# Patient Record
Sex: Male | Born: 1945 | Race: Black or African American | Hispanic: No | Smoking: Former smoker
Health system: Southern US, Community
[De-identification: ages and names within clinical notes are randomized; demographics above are authoritative.]

## PROBLEM LIST (undated history)

## (undated) DIAGNOSIS — E079 Disorder of thyroid, unspecified: Secondary | ICD-10-CM

## (undated) DIAGNOSIS — E785 Hyperlipidemia, unspecified: Secondary | ICD-10-CM

## (undated) DIAGNOSIS — M1990 Unspecified osteoarthritis, unspecified site: Secondary | ICD-10-CM

## (undated) DIAGNOSIS — E119 Type 2 diabetes mellitus without complications: Secondary | ICD-10-CM

## (undated) DIAGNOSIS — G5603 Carpal tunnel syndrome, bilateral upper limbs: Secondary | ICD-10-CM

## (undated) DIAGNOSIS — H269 Unspecified cataract: Secondary | ICD-10-CM

## (undated) DIAGNOSIS — M5412 Radiculopathy, cervical region: Secondary | ICD-10-CM

## (undated) DIAGNOSIS — R011 Cardiac murmur, unspecified: Secondary | ICD-10-CM

## (undated) HISTORY — PX: COLONOSCOPY: SHX174

## (undated) HISTORY — DX: Radiculopathy, cervical region: M54.12

## (undated) HISTORY — PX: OTHER SURGICAL HISTORY: SHX169

## (undated) HISTORY — DX: Unspecified cataract: H26.9

## (undated) HISTORY — PX: CATARACT EXTRACTION W/ INTRAOCULAR LENS IMPLANT: SHX1309

## (undated) HISTORY — DX: Disorder of thyroid, unspecified: E07.9

## (undated) HISTORY — DX: Hyperlipidemia, unspecified: E78.5

## (undated) HISTORY — DX: Carpal tunnel syndrome, bilateral upper limbs: G56.03

## (undated) HISTORY — DX: Type 2 diabetes mellitus without complications: E11.9

## (undated) HISTORY — PX: BILATERAL CARPAL TUNNEL RELEASE: SHX6508

---

## 1999-11-19 ENCOUNTER — Encounter: Admission: RE | Admit: 1999-11-19 | Discharge: 1999-11-19 | Payer: Self-pay | Admitting: Emergency Medicine

## 1999-11-19 ENCOUNTER — Encounter: Payer: Self-pay | Admitting: Emergency Medicine

## 2005-06-11 ENCOUNTER — Ambulatory Visit (HOSPITAL_COMMUNITY): Admission: RE | Admit: 2005-06-11 | Discharge: 2005-06-11 | Payer: Self-pay | Admitting: Ophthalmology

## 2010-09-28 ENCOUNTER — Encounter
Admission: RE | Admit: 2010-09-28 | Discharge: 2010-09-28 | Payer: Self-pay | Source: Home / Self Care | Attending: Internal Medicine | Admitting: Internal Medicine

## 2010-10-19 ENCOUNTER — Encounter
Admission: RE | Admit: 2010-10-19 | Discharge: 2010-10-19 | Payer: Self-pay | Source: Home / Self Care | Attending: Internal Medicine | Admitting: Internal Medicine

## 2010-10-30 ENCOUNTER — Other Ambulatory Visit (HOSPITAL_COMMUNITY): Payer: Self-pay | Admitting: Neurosurgery

## 2010-10-30 ENCOUNTER — Ambulatory Visit (HOSPITAL_COMMUNITY)
Admission: RE | Admit: 2010-10-30 | Discharge: 2010-10-30 | Disposition: A | Discharge status: Home / Self Care | Payer: 59 | Source: Ambulatory Visit | Attending: Neurosurgery | Admitting: Neurosurgery

## 2010-10-30 ENCOUNTER — Encounter (HOSPITAL_COMMUNITY)
Admission: RE | Admit: 2010-10-30 | Discharge: 2010-10-30 | Disposition: A | Discharge status: Home / Self Care | Payer: 59 | Source: Ambulatory Visit | Attending: Neurosurgery | Admitting: Neurosurgery

## 2010-10-30 DIAGNOSIS — M5020 Other cervical disc displacement, unspecified cervical region: Secondary | ICD-10-CM | POA: Insufficient documentation

## 2010-10-30 DIAGNOSIS — Z01818 Encounter for other preprocedural examination: Secondary | ICD-10-CM | POA: Insufficient documentation

## 2010-10-30 LAB — CBC
HCT: 38.4 % — ABNORMAL LOW (ref 39.0–52.0)
Hemoglobin: 13.6 g/dL (ref 13.0–17.0)
MCH: 31.1 pg (ref 26.0–34.0)
MCHC: 35.4 g/dL (ref 30.0–36.0)
MCV: 87.9 fL (ref 78.0–100.0)
Platelets: 235 10*3/uL (ref 150–400)
RBC: 4.37 MIL/uL (ref 4.22–5.81)
RDW: 12.9 % (ref 11.5–15.5)
WBC: 9 10*3/uL (ref 4.0–10.5)

## 2010-10-30 LAB — SURGICAL PCR SCREEN: Staphylococcus aureus: NEGATIVE

## 2010-11-02 ENCOUNTER — Ambulatory Visit (HOSPITAL_COMMUNITY): Payer: 59

## 2010-11-02 ENCOUNTER — Inpatient Hospital Stay (HOSPITAL_COMMUNITY)
Admission: RE | Admit: 2010-11-02 | Discharge: 2010-11-05 | DRG: 473 | Disposition: A | Discharge status: Home / Self Care | Payer: 59 | Source: Ambulatory Visit | Attending: Neurosurgery | Admitting: Neurosurgery

## 2010-11-02 DIAGNOSIS — M4712 Other spondylosis with myelopathy, cervical region: Principal | ICD-10-CM | POA: Diagnosis present

## 2010-11-06 NOTE — Op Note (Addendum)
NAME:  Stephen, Barnett NO.:  [id number]  MEDICAL RECORD NO.:  [id number]           PATIENT TYPE:  O  LOCATION:  XRAY                         FACILITY:  [facility]  PHYSICIAN:  Danae Orleans. Venetia Maxon, M.D.  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  PROCEDURE: 1. C2-C6 posterior cervical decompression by laminectomy. 2. Fusion with C2 pedicle screws and C3-C6 lateral mass screws and     rods. 3. Posterolateral arthrodesis with local autograft, Puragen, and     allograft.  SURGEON:  Danae Orleans. Venetia Maxon, MD  ASSISTANT:  Georgiann Cocker, RN and Clydene Fake, MD  ANESTHESIA:  General endotracheal anesthesia.  ESTIMATED BLOOD LOSS:  100 mL.  COMPLICATIONS:  None.  DISPOSITION:  Recovery.  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  It was elected to take him to surgery for posterior cervical decompression and fusion.  He has marked spasticity and is severely limited in terms of spastic gait and by weakness.  He has cord signal from C3 through C4.  He has significant spinal cord compression C2, C3- 4, and C5-6 levels.  PROCEDURE:  Mr. Stephen Barnett was brought to the operating room.  Following satisfactory and uncomplicated induction of general endotracheal anesthesia plus intravenous lines and Foley catheter, he was maintained in neutral alignment, placed in cervical collar, turned into a prone position on the operating table on chest rolls.  His neck was maintained in neutral alignment.  His shoulders were taped and padded appropriately.  His posterior  neck was then shaved, prepped, and draped in usual sterile fashion.  Area of planned incision was infiltrated with local lidocaine.  Incision was made from C2 to C3-C6, carried through copious adipose tissue to the posterior cervical fascia which was incised with electrocautery.  Subperiosteal dissection was performed exposing the C2-C6 spinous processes and lateral masses bilaterally. The patient is extremely large, 75-mm retractor blades were required to gain adequate exposure.  After identifying the lateral masses and confirming correct level, the spinous processes of C2, C3, C4, C5, and C6 were removed and lateral mass screws using 4 x 12 mm screws were placed at C3, C4, C5, and C6.  Subsequently, the posterior cervical decompression was performed with Leksell and 2-3 mm gold tip Kerrison rongeurs to decompress the spinal cord dura from C2 to the top of C7. The dura was well decompressed.  Spinal cord appeared to be pulsatile. The care was taken to adequately decompress the spinal cord at the C2-3 level and with decompression well into C2.  The pedicle screws were then placed at C2 using the C-arm fluoroscopy and also according to standard landmarks, the 28-mm x 4-mm screws were placed, one at bilaterally and their position was confirmed on lateral fluoroscopy.  The rods were then  cut and dosed to fit the screw heads appropriately, locked down in situ. The posterolateral region was extensively decorticated prior to placing the rods and then 10 mL of Vitoss foam which had been reconstituted. Large Puragen was then placed in the posterolateral region along with the autograft which had been cleaned from the investing soft tissues and morselized.  A medium Hemovac chain drain was inserted through separate stab incision.  The wound was then closed with 0, 2-0, and 3-0 Vicryl sutures and staples were then reapproximated with skin edges.  Sterile occlusive dressing was placed.  The patient  was taken to recovery and tolerated the procedure well.     Danae Orleans. Venetia Maxon, M.D.     JDS/MEDQ  D:  11/02/2010  T:  11/03/2010  Job:  [id number]  Electronically Signed by Maeola Harman M.D. on 11/06/2010 10:06:32 AM

## 2010-11-30 NOTE — Discharge Summary (Signed)
  NAME:  DOSS, CYBULSKI NO.:  [id number]  MEDICAL RECORD NO.:  [id number]           PATIENT TYPE:  I  LOCATION:  3038                         FACILITY:  [facility]  PHYSICIAN:  Danae Orleans. Venetia Maxon, M.D.  DATE OF BIRTH:  1945/11/26  DATE OF ADMISSION:  11/02/2010 DATE OF DISCHARGE:  11/05/2010                              DISCHARGE SUMMARY   REASON FOR ADMISSION:  Stephen Barnett is a 65 year old man with severe cervical spondylitic myelopathy and cervical stenosis.  He was admitted to the hospital for management of the spastic myelopathy with cord compression and he underwent posterior cervical decompression and fusion C2 through C6 levels with decompression and hardware fixation. Postoperatively, the patient was observed in the ICU.  He had a drain. He was still hyperreflexic, but had improved strength.  The patient was doing well on November 05, 2010, and was discharged home as of November 05, 2010, with return to the office 10 days postoperatively for staple removal.  Discharge medications include Levothroid 100 mcg daily, his preoperative medication along with Percocet and Valium for muscle spasm.     Danae Orleans. Venetia Maxon, M.D.     JDS/MEDQ  D:  11/15/2010  T:  11/15/2010  Job:  [id number]  Electronically Signed by Maeola Harman M.D. on 11/30/2010 12:13:54 PM

## 2011-02-08 NOTE — Op Note (Signed)
NAME:  Stephen Barnett, Stephen Barnett NO.:  [id number]   MEDICAL RECORD NO.:  [id number]          PATIENT TYPE:  AMB   LOCATION:  SDS                          FACILITY:  [facility]   PHYSICIAN:  Guadelupe Sabin, M.D.DATE OF BIRTH:  15-Sep-1946   DATE OF PROCEDURE:  06/11/2005  DATE OF DISCHARGE:                                 OPERATIVE REPORT   PREOPERATIVE DIAGNOSIS:  Posterior subcapsular cataract, right eye.   POSTOPERATIVE DIAGNOSIS:  Posterior subcapsular cataract, right eye.   OPERATION:  Planned extracapsular cataract extraction - phacoemulsification,  primary insertion of posterior chamber intraocular lens implant.   SURGEON:  Stormy Fabian, M.D.   ASSISTANT:  Nurse.   ANESTHESIA:  Local 4% Xylocaine, 0.75% Marcaine retrobulbar blocking. The  patient was given intravenous anesthesia during the period of retrobulbar  blocking.   OPERATIVE PROCEDURE:  After the patient was prepped and draped, a lid  speculum was inserted in the right eye. Schiotz tonometry was recorded at 7-  8 scale units with a 5.5 grams weight. A peritomy was performed adjacent to  the limbus from the 11-1 o'clock position. The corneoscleral junction was  cleaned and a corneoscleral groove made with a 45 degrees Superblade. The  anterior chamber was then entered with the 2.5 mm diamond keratome at the 12  o'clock position and a 15 degrees blade at the 2:30 position. Using a bent  26-gauge needle on an Ocucoat syringe, a circular capsulorrhexis was begun  and then completed with the Grabow forceps. Hydrodissection and  hydrodelineation were performed using 1% Xylocaine. The 30 degrees  phacoemulsification tip was then inserted with slow controlled  emulsification of the lens nucleus with back cracking and fragmentation with  the Bechert pick. Total ultrasonic time was 58 seconds, average power level  14%, total amount of fluid used 60 mL. Following removal of the nucleus, the  residual cortex was  aspirated with the irrigation-aspiration tip. The  posterior capsule appeared intact with a brilliant red fundus reflex. It  was, therefore, elected to insert an [employer] SI 40 NB  silicone three-piece posterior chamber intraocular lens implant, diopter  strength +19.50. This was inserted with the McDonald forceps into the  anterior chamber and then centered into the capsular bag using the [facility]  lens rotator. The lens appeared to be well-centered. The Ocucoat and Provisc  which had been used intermittently throughout the procedures were aspirated  and replaced with balanced salt solution and Miochol ophthalmic solution.  The operative incision appeared to be self-sealing. It was, however, elected  to place a single 10-0 interrupted nylon suture across the 12 o'clock  incision to ensure closure and to prevent endophthalmitis. Maxitrol ointment  was  instilled in the conjunctival cul-de-sac and a light patch and protector  shield applied. Duration of procedure and anesthesia administration was 45  minutes. The patient tolerated the procedure well in general and left the  operating room for the recovery room in good condition.           ______________________________  Guadelupe Sabin, M.D.     HNJ/MEDQ  D:  06/11/2005  T:  06/11/2005  Job:  [id number]

## 2011-02-08 NOTE — H&P (Signed)
NAME:  Stephen Barnett, Stephen Barnett NO.:  [id number]   MEDICAL RECORD NO.:  [id number]          PATIENT TYPE:  AMB   LOCATION:  SDS                          FACILITY:  [facility]   PHYSICIAN:  Guadelupe Sabin, M.D.DATE OF BIRTH:  1946/05/08   DATE OF ADMISSION:  06/11/2005  DATE OF DISCHARGE:                                HISTORY & PHYSICAL   This was a planned outpatient admission of this 65 year old male admitted  for cataract implant surgery of the right eye.   PRESENT ILLNESS:  This patient was first seen in my office at the request of  Dr. Leslee Home, his internist, for evaluation of decreased vision. Initial  examination in my office revealed a visual acuity of 20/100 right eye, 20/30  minus left eye without correction and 20/40 right eye, 20/20 left eye with  correction. The patient was moderately myopic greater in the right than left  eye. Slit lamp examination revealed a clear cornea, deep and clear anterior  chamber and a posterior subcapsular cataract greater in the right than left  eye. It was felt that this was changing his refraction and causing his  blurred vision. He elected to proceed with cataract implant surgery of the  right eye now, left eye later as needed. He was given oral discussion and  printed information concerning the procedure. He signed an informed consent  and arrangements were made for his outpatient admission at this time.   PAST MEDICAL HISTORY:  The patient is in good general health under the care  of Dr. Lorenz Coaster and takes no prescribed medications and has no known  allergies.   REVIEW OF SYSTEMS:  No cardiorespiratory complaints.   PHYSICAL EXAMINATION:  VITAL SIGNS:  As recorded on admission blood pressure  119/78, pulse 56, respirations 16, temperature 97.1.  GENERAL APPEARANCE:  The patient is a pleasant well-nourished, well-  developed male in no acute distress.  HEENT:  EYES:  Visual ocular exam as noted above.  CHEST/LUNGS:  Clear  to percussion and auscultation.  HEART:  Normal sinus rhythm, no cardiomegaly. No murmurs.  ABDOMEN:  Negative.  EXTREMITIES:  Negative.   ADMISSION DIAGNOSIS:  Posterior subcapsular cataract right eye greater than  left.   SURGICAL PLAN:  Cataract implant surgery right eye now, left eye later.           ______________________________  Guadelupe Sabin, M.D.     HNJ/MEDQ  D:  06/11/2005  T:  06/11/2005  Job:  [id number]   cc:   Reuben Likes, M.D.  [address]  [place]  [place] [address]  Fax: [phone]

## 2012-12-10 DIAGNOSIS — Z125 Encounter for screening for malignant neoplasm of prostate: Secondary | ICD-10-CM | POA: Diagnosis not present

## 2012-12-10 DIAGNOSIS — Z0000 Encounter for general adult medical examination without abnormal findings: Secondary | ICD-10-CM | POA: Diagnosis not present

## 2013-01-11 DIAGNOSIS — R7309 Other abnormal glucose: Secondary | ICD-10-CM | POA: Diagnosis not present

## 2013-01-11 DIAGNOSIS — E039 Hypothyroidism, unspecified: Secondary | ICD-10-CM | POA: Diagnosis not present

## 2013-04-12 DIAGNOSIS — R7989 Other specified abnormal findings of blood chemistry: Secondary | ICD-10-CM | POA: Diagnosis not present

## 2013-12-14 DIAGNOSIS — E039 Hypothyroidism, unspecified: Secondary | ICD-10-CM | POA: Diagnosis not present

## 2013-12-14 DIAGNOSIS — Z6834 Body mass index (BMI) 34.0-34.9, adult: Secondary | ICD-10-CM | POA: Diagnosis not present

## 2013-12-14 DIAGNOSIS — E663 Overweight: Secondary | ICD-10-CM | POA: Diagnosis not present

## 2013-12-14 DIAGNOSIS — Z0000 Encounter for general adult medical examination without abnormal findings: Secondary | ICD-10-CM | POA: Diagnosis not present

## 2013-12-14 DIAGNOSIS — E785 Hyperlipidemia, unspecified: Secondary | ICD-10-CM | POA: Diagnosis not present

## 2013-12-14 DIAGNOSIS — Z125 Encounter for screening for malignant neoplasm of prostate: Secondary | ICD-10-CM | POA: Diagnosis not present

## 2013-12-14 DIAGNOSIS — R7309 Other abnormal glucose: Secondary | ICD-10-CM | POA: Diagnosis not present

## 2014-01-12 DIAGNOSIS — Z23 Encounter for immunization: Secondary | ICD-10-CM | POA: Diagnosis not present

## 2014-12-21 ENCOUNTER — Ambulatory Visit
Admission: RE | Admit: 2014-12-21 | Discharge: 2014-12-21 | Disposition: A | Discharge status: Home / Self Care | Payer: Medicare Other | Source: Ambulatory Visit | Attending: Internal Medicine | Admitting: Internal Medicine

## 2014-12-21 ENCOUNTER — Other Ambulatory Visit: Payer: Self-pay | Admitting: Internal Medicine

## 2014-12-21 DIAGNOSIS — E785 Hyperlipidemia, unspecified: Secondary | ICD-10-CM | POA: Diagnosis not present

## 2014-12-21 DIAGNOSIS — I10 Essential (primary) hypertension: Secondary | ICD-10-CM | POA: Diagnosis not present

## 2014-12-21 DIAGNOSIS — Z0000 Encounter for general adult medical examination without abnormal findings: Secondary | ICD-10-CM | POA: Diagnosis not present

## 2014-12-21 DIAGNOSIS — E039 Hypothyroidism, unspecified: Secondary | ICD-10-CM | POA: Diagnosis not present

## 2014-12-21 DIAGNOSIS — M5136 Other intervertebral disc degeneration, lumbar region: Secondary | ICD-10-CM | POA: Diagnosis not present

## 2014-12-21 DIAGNOSIS — Z1389 Encounter for screening for other disorder: Secondary | ICD-10-CM | POA: Diagnosis not present

## 2014-12-21 DIAGNOSIS — Z125 Encounter for screening for malignant neoplasm of prostate: Secondary | ICD-10-CM | POA: Diagnosis not present

## 2014-12-21 DIAGNOSIS — M47816 Spondylosis without myelopathy or radiculopathy, lumbar region: Secondary | ICD-10-CM | POA: Diagnosis not present

## 2014-12-21 DIAGNOSIS — M545 Low back pain: Secondary | ICD-10-CM

## 2014-12-21 DIAGNOSIS — R7309 Other abnormal glucose: Secondary | ICD-10-CM | POA: Diagnosis not present

## 2015-01-02 DIAGNOSIS — R7309 Other abnormal glucose: Secondary | ICD-10-CM | POA: Diagnosis not present

## 2015-06-01 DIAGNOSIS — I10 Essential (primary) hypertension: Secondary | ICD-10-CM | POA: Diagnosis not present

## 2015-06-01 DIAGNOSIS — M545 Low back pain: Secondary | ICD-10-CM | POA: Diagnosis not present

## 2015-06-07 ENCOUNTER — Other Ambulatory Visit: Payer: Self-pay | Admitting: Internal Medicine

## 2015-06-07 DIAGNOSIS — M5430 Sciatica, unspecified side: Secondary | ICD-10-CM

## 2015-06-12 ENCOUNTER — Ambulatory Visit
Admission: RE | Admit: 2015-06-12 | Discharge: 2015-06-12 | Disposition: A | Discharge status: Home / Self Care | Payer: Medicare Other | Source: Ambulatory Visit | Attending: Internal Medicine | Admitting: Internal Medicine

## 2015-06-12 DIAGNOSIS — M5430 Sciatica, unspecified side: Secondary | ICD-10-CM

## 2015-06-12 DIAGNOSIS — M4806 Spinal stenosis, lumbar region: Secondary | ICD-10-CM | POA: Diagnosis not present

## 2015-07-24 DIAGNOSIS — Z6833 Body mass index (BMI) 33.0-33.9, adult: Secondary | ICD-10-CM | POA: Diagnosis not present

## 2015-07-24 DIAGNOSIS — M5416 Radiculopathy, lumbar region: Secondary | ICD-10-CM | POA: Diagnosis not present

## 2015-07-24 DIAGNOSIS — M5126 Other intervertebral disc displacement, lumbar region: Secondary | ICD-10-CM | POA: Diagnosis not present

## 2015-07-24 DIAGNOSIS — R030 Elevated blood-pressure reading, without diagnosis of hypertension: Secondary | ICD-10-CM | POA: Diagnosis not present

## 2015-07-24 DIAGNOSIS — M545 Low back pain: Secondary | ICD-10-CM | POA: Diagnosis not present

## 2015-07-28 DIAGNOSIS — Z01812 Encounter for preprocedural laboratory examination: Secondary | ICD-10-CM | POA: Diagnosis not present

## 2015-07-28 DIAGNOSIS — M5126 Other intervertebral disc displacement, lumbar region: Secondary | ICD-10-CM | POA: Diagnosis not present

## 2015-08-04 DIAGNOSIS — M5136 Other intervertebral disc degeneration, lumbar region: Secondary | ICD-10-CM | POA: Diagnosis not present

## 2015-08-04 DIAGNOSIS — M4726 Other spondylosis with radiculopathy, lumbar region: Secondary | ICD-10-CM | POA: Diagnosis not present

## 2015-08-04 DIAGNOSIS — M5126 Other intervertebral disc displacement, lumbar region: Secondary | ICD-10-CM | POA: Diagnosis not present

## 2015-08-04 DIAGNOSIS — M5116 Intervertebral disc disorders with radiculopathy, lumbar region: Secondary | ICD-10-CM | POA: Diagnosis not present

## 2015-12-11 DIAGNOSIS — M5416 Radiculopathy, lumbar region: Secondary | ICD-10-CM | POA: Diagnosis not present

## 2015-12-11 DIAGNOSIS — M5126 Other intervertebral disc displacement, lumbar region: Secondary | ICD-10-CM | POA: Diagnosis not present

## 2015-12-11 DIAGNOSIS — M545 Low back pain: Secondary | ICD-10-CM | POA: Diagnosis not present

## 2015-12-11 DIAGNOSIS — Z6835 Body mass index (BMI) 35.0-35.9, adult: Secondary | ICD-10-CM | POA: Diagnosis not present

## 2015-12-11 DIAGNOSIS — R030 Elevated blood-pressure reading, without diagnosis of hypertension: Secondary | ICD-10-CM | POA: Diagnosis not present

## 2015-12-27 DIAGNOSIS — Z0000 Encounter for general adult medical examination without abnormal findings: Secondary | ICD-10-CM | POA: Diagnosis not present

## 2015-12-27 DIAGNOSIS — E785 Hyperlipidemia, unspecified: Secondary | ICD-10-CM | POA: Diagnosis not present

## 2015-12-27 DIAGNOSIS — Z125 Encounter for screening for malignant neoplasm of prostate: Secondary | ICD-10-CM | POA: Diagnosis not present

## 2015-12-27 DIAGNOSIS — E039 Hypothyroidism, unspecified: Secondary | ICD-10-CM | POA: Diagnosis not present

## 2015-12-27 DIAGNOSIS — Z23 Encounter for immunization: Secondary | ICD-10-CM | POA: Diagnosis not present

## 2015-12-27 DIAGNOSIS — Z1389 Encounter for screening for other disorder: Secondary | ICD-10-CM | POA: Diagnosis not present

## 2015-12-27 DIAGNOSIS — D126 Benign neoplasm of colon, unspecified: Secondary | ICD-10-CM | POA: Diagnosis not present

## 2016-01-30 DIAGNOSIS — K640 First degree hemorrhoids: Secondary | ICD-10-CM | POA: Diagnosis not present

## 2016-01-30 DIAGNOSIS — K626 Ulcer of anus and rectum: Secondary | ICD-10-CM | POA: Diagnosis not present

## 2016-01-30 DIAGNOSIS — K6289 Other specified diseases of anus and rectum: Secondary | ICD-10-CM | POA: Diagnosis not present

## 2016-01-30 DIAGNOSIS — Z86010 Personal history of colonic polyps: Secondary | ICD-10-CM | POA: Diagnosis not present

## 2016-04-17 DIAGNOSIS — R030 Elevated blood-pressure reading, without diagnosis of hypertension: Secondary | ICD-10-CM | POA: Diagnosis not present

## 2016-04-17 DIAGNOSIS — Z6835 Body mass index (BMI) 35.0-35.9, adult: Secondary | ICD-10-CM | POA: Diagnosis not present

## 2016-04-17 DIAGNOSIS — M5416 Radiculopathy, lumbar region: Secondary | ICD-10-CM | POA: Diagnosis not present

## 2016-04-17 DIAGNOSIS — M545 Low back pain: Secondary | ICD-10-CM | POA: Diagnosis not present

## 2016-04-22 ENCOUNTER — Other Ambulatory Visit: Payer: Self-pay | Admitting: Neurosurgery

## 2016-04-22 DIAGNOSIS — M5416 Radiculopathy, lumbar region: Secondary | ICD-10-CM

## 2016-05-02 ENCOUNTER — Ambulatory Visit
Admission: RE | Admit: 2016-05-02 | Discharge: 2016-05-02 | Disposition: A | Discharge status: Home / Self Care | Payer: Medicare Other | Source: Ambulatory Visit | Attending: Neurosurgery | Admitting: Neurosurgery

## 2016-05-02 DIAGNOSIS — M5416 Radiculopathy, lumbar region: Secondary | ICD-10-CM

## 2016-05-02 DIAGNOSIS — M4806 Spinal stenosis, lumbar region: Secondary | ICD-10-CM | POA: Diagnosis not present

## 2016-05-02 MED ORDER — GADOBENATE DIMEGLUMINE 529 MG/ML IV SOLN
20.0000 mL | Freq: Once | INTRAVENOUS | Status: AC | PRN
  Administered 2016-05-02: 20 mL via INTRAVENOUS

## 2016-05-08 ENCOUNTER — Other Ambulatory Visit: Payer: Self-pay | Admitting: Neurosurgery

## 2016-05-08 DIAGNOSIS — M5126 Other intervertebral disc displacement, lumbar region: Secondary | ICD-10-CM | POA: Diagnosis not present

## 2016-05-08 DIAGNOSIS — Z6835 Body mass index (BMI) 35.0-35.9, adult: Secondary | ICD-10-CM | POA: Diagnosis not present

## 2016-05-08 DIAGNOSIS — R030 Elevated blood-pressure reading, without diagnosis of hypertension: Secondary | ICD-10-CM | POA: Diagnosis not present

## 2016-05-08 DIAGNOSIS — M545 Low back pain: Secondary | ICD-10-CM | POA: Diagnosis not present

## 2016-05-08 DIAGNOSIS — M5416 Radiculopathy, lumbar region: Secondary | ICD-10-CM | POA: Diagnosis not present

## 2016-05-09 ENCOUNTER — Encounter (HOSPITAL_COMMUNITY): Payer: Self-pay | Admitting: *Deleted

## 2016-05-09 NOTE — Progress Notes (Signed)
Spoke with pt for pre-op call. Pt denies cardiac history, chest pain or sob. Pt's PCP is Dr. Seward Carol.

## 2016-05-10 ENCOUNTER — Encounter (HOSPITAL_COMMUNITY): Payer: Self-pay | Admitting: *Deleted

## 2016-05-10 ENCOUNTER — Observation Stay (HOSPITAL_COMMUNITY)
Admission: RE | Admit: 2016-05-10 | Discharge: 2016-05-10 | Disposition: A | Discharge status: Home / Self Care | Payer: Medicare Other | Source: Ambulatory Visit | Attending: Neurosurgery | Admitting: Neurosurgery

## 2016-05-10 ENCOUNTER — Ambulatory Visit (HOSPITAL_COMMUNITY): Payer: Medicare Other

## 2016-05-10 ENCOUNTER — Ambulatory Visit (HOSPITAL_COMMUNITY): Payer: Medicare Other | Admitting: Anesthesiology

## 2016-05-10 ENCOUNTER — Encounter (HOSPITAL_COMMUNITY)
Admission: RE | Disposition: A | Discharge status: Home / Self Care | Payer: Self-pay | Source: Ambulatory Visit | Attending: Neurosurgery

## 2016-05-10 DIAGNOSIS — M1990 Unspecified osteoarthritis, unspecified site: Secondary | ICD-10-CM | POA: Diagnosis not present

## 2016-05-10 DIAGNOSIS — Z87891 Personal history of nicotine dependence: Secondary | ICD-10-CM | POA: Insufficient documentation

## 2016-05-10 DIAGNOSIS — M5116 Intervertebral disc disorders with radiculopathy, lumbar region: Secondary | ICD-10-CM | POA: Diagnosis not present

## 2016-05-10 DIAGNOSIS — Z01812 Encounter for preprocedural laboratory examination: Secondary | ICD-10-CM | POA: Diagnosis not present

## 2016-05-10 DIAGNOSIS — M5126 Other intervertebral disc displacement, lumbar region: Secondary | ICD-10-CM | POA: Diagnosis present

## 2016-05-10 DIAGNOSIS — M5416 Radiculopathy, lumbar region: Secondary | ICD-10-CM | POA: Diagnosis not present

## 2016-05-10 DIAGNOSIS — Z419 Encounter for procedure for purposes other than remedying health state, unspecified: Secondary | ICD-10-CM

## 2016-05-10 DIAGNOSIS — M4806 Spinal stenosis, lumbar region: Secondary | ICD-10-CM | POA: Diagnosis not present

## 2016-05-10 HISTORY — DX: Cardiac murmur, unspecified: R01.1

## 2016-05-10 HISTORY — DX: Unspecified osteoarthritis, unspecified site: M19.90

## 2016-05-10 HISTORY — PX: LUMBAR LAMINECTOMY/DECOMPRESSION MICRODISCECTOMY: SHX5026

## 2016-05-10 LAB — CBC
HEMATOCRIT: 41.4 % (ref 39.0–52.0)
Hemoglobin: 14.5 g/dL (ref 13.0–17.0)
MCH: 31.5 pg (ref 26.0–34.0)
MCHC: 35 g/dL (ref 30.0–36.0)
MCV: 90 fL (ref 78.0–100.0)
PLATELETS: 210 10*3/uL (ref 150–400)
RBC: 4.6 MIL/uL (ref 4.22–5.81)
RDW: 12.7 % (ref 11.5–15.5)
WBC: 10.2 10*3/uL (ref 4.0–10.5)

## 2016-05-10 LAB — SURGICAL PCR SCREEN
MRSA, PCR: NEGATIVE
STAPHYLOCOCCUS AUREUS: NEGATIVE

## 2016-05-10 LAB — BASIC METABOLIC PANEL
Anion gap: 13 (ref 5–15)
BUN: 12 mg/dL (ref 6–20)
CALCIUM: 10 mg/dL (ref 8.9–10.3)
CO2: 23 mmol/L (ref 22–32)
CREATININE: 1.21 mg/dL (ref 0.61–1.24)
Chloride: 101 mmol/L (ref 101–111)
GFR calc Af Amer: >60 mL/min (ref >60)
GFR, EST NON AFRICAN AMERICAN: 60 mL/min — AB (ref >60)
Glucose, Bld: 172 mg/dL — ABNORMAL HIGH (ref 65–99)
POTASSIUM: 3.8 mmol/L (ref 3.5–5.1)
SODIUM: 137 mmol/L (ref 135–145)

## 2016-05-10 SURGERY — LUMBAR LAMINECTOMY/DECOMPRESSION MICRODISCECTOMY 1 LEVEL
Anesthesia: General

## 2016-05-10 MED ORDER — KCL IN DEXTROSE-NACL 20-5-0.45 MEQ/L-%-% IV SOLN: INTRAVENOUS | Status: DC

## 2016-05-10 MED ORDER — PROPOFOL 10 MG/ML IV BOLUS
INTRAVENOUS | Status: DC | PRN
  Administered 2016-05-10: 200 mg via INTRAVENOUS

## 2016-05-10 MED ORDER — ZOLPIDEM TARTRATE 5 MG PO TABS: 5.0000 mg | ORAL_TABLET | Freq: Every evening | ORAL | Status: DC | PRN

## 2016-05-10 MED ORDER — CEFAZOLIN SODIUM-DEXTROSE 2-4 GM/100ML-% IV SOLN
2.0000 g | INTRAVENOUS | Status: AC
  Administered 2016-05-10: 2 g via INTRAVENOUS
  Administered 2016-05-10: 1 g via INTRAVENOUS
  Filled 2016-05-10: qty 100

## 2016-05-10 MED ORDER — PROPOFOL 10 MG/ML IV BOLUS
INTRAVENOUS | Status: AC
  Filled 2016-05-10: qty 20

## 2016-05-10 MED ORDER — ONDANSETRON HCL 4 MG/2ML IJ SOLN: 4.0000 mg | INTRAMUSCULAR | Status: DC | PRN

## 2016-05-10 MED ORDER — BISACODYL 10 MG RE SUPP: 10.0000 mg | Freq: Every day | RECTAL | Status: DC | PRN

## 2016-05-10 MED ORDER — FLEET ENEMA 7-19 GM/118ML RE ENEM: 1.0000 | ENEMA | Freq: Once | RECTAL | Status: DC | PRN

## 2016-05-10 MED ORDER — ROCURONIUM BROMIDE 100 MG/10ML IV SOLN
INTRAVENOUS | Status: DC | PRN
  Administered 2016-05-10: 60 mg via INTRAVENOUS

## 2016-05-10 MED ORDER — DEXTROSE 5 % IV SOLN
INTRAVENOUS | Status: DC | PRN
  Administered 2016-05-10: 10 ug/min via INTRAVENOUS

## 2016-05-10 MED ORDER — HYDROMORPHONE HCL 1 MG/ML IJ SOLN: 0.2500 mg | INTRAMUSCULAR | Status: DC | PRN

## 2016-05-10 MED ORDER — LIDOCAINE-EPINEPHRINE 1 %-1:100000 IJ SOLN
INTRAMUSCULAR | Status: DC | PRN
  Administered 2016-05-10: 10 mL

## 2016-05-10 MED ORDER — FENTANYL CITRATE (PF) 100 MCG/2ML IJ SOLN
INTRAMUSCULAR | Status: AC
  Filled 2016-05-10: qty 4

## 2016-05-10 MED ORDER — OXYCODONE-ACETAMINOPHEN 5-325 MG PO TABS
1.0000 | ORAL_TABLET | ORAL | Status: DC | PRN
Start: 2016-05-10 — End: 2016-05-10
  Administered 2016-05-10: 2 via ORAL

## 2016-05-10 MED ORDER — BUPIVACAINE HCL (PF) 0.5 % IJ SOLN
INTRAMUSCULAR | Status: DC | PRN
  Administered 2016-05-10: 10 mL

## 2016-05-10 MED ORDER — 0.9 % SODIUM CHLORIDE (POUR BTL) OPTIME
TOPICAL | Status: DC | PRN
  Administered 2016-05-10: 1000 mL

## 2016-05-10 MED ORDER — ASPIRIN 81 MG PO TABS: 81.0000 mg | ORAL_TABLET | Freq: Every day | ORAL | Status: DC

## 2016-05-10 MED ORDER — SODIUM CHLORIDE 0.9% FLUSH: 3.0000 mL | Freq: Two times a day (BID) | INTRAVENOUS | Status: DC

## 2016-05-10 MED ORDER — CHLORHEXIDINE GLUCONATE CLOTH 2 % EX PADS: 6.0000 | MEDICATED_PAD | Freq: Once | CUTANEOUS | Status: DC

## 2016-05-10 MED ORDER — ONDANSETRON HCL 4 MG/2ML IJ SOLN
INTRAMUSCULAR | Status: DC | PRN
  Administered 2016-05-10: 4 mg via INTRAVENOUS

## 2016-05-10 MED ORDER — FENTANYL CITRATE (PF) 250 MCG/5ML IJ SOLN
INTRAMUSCULAR | Status: DC | PRN
  Administered 2016-05-10: 50 ug via INTRAVENOUS
  Administered 2016-05-10: 100 ug via INTRAVENOUS
  Administered 2016-05-10: 50 ug via INTRAVENOUS

## 2016-05-10 MED ORDER — OXYCODONE-ACETAMINOPHEN 5-325 MG PO TABS
ORAL_TABLET | ORAL | Status: AC
  Filled 2016-05-10: qty 2

## 2016-05-10 MED ORDER — EPHEDRINE SULFATE 50 MG/ML IJ SOLN
INTRAMUSCULAR | Status: DC | PRN
  Administered 2016-05-10 (×2): 5 mg via INTRAVENOUS

## 2016-05-10 MED ORDER — LACTATED RINGERS IV SOLN: INTRAVENOUS | Status: DC

## 2016-05-10 MED ORDER — MENTHOL 3 MG MT LOZG: 1.0000 | LOZENGE | OROMUCOSAL | Status: DC | PRN

## 2016-05-10 MED ORDER — CEFAZOLIN SODIUM-DEXTROSE 2-4 GM/100ML-% IV SOLN
2.0000 g | Freq: Three times a day (TID) | INTRAVENOUS | Status: DC
Start: 2016-05-10 — End: 2016-05-10
  Administered 2016-05-10: 2 g via INTRAVENOUS

## 2016-05-10 MED ORDER — FAMOTIDINE IN NACL 20-0.9 MG/50ML-% IV SOLN
20.0000 mg | Freq: Two times a day (BID) | INTRAVENOUS | Status: DC
  Filled 2016-05-10: qty 50

## 2016-05-10 MED ORDER — HYDROMORPHONE HCL 1 MG/ML IJ SOLN: 0.5000 mg | INTRAMUSCULAR | Status: DC | PRN

## 2016-05-10 MED ORDER — MUPIROCIN 2 % EX OINT
1.0000 "application " | TOPICAL_OINTMENT | Freq: Once | CUTANEOUS | Status: AC
  Administered 2016-05-10: 1 via TOPICAL
  Filled 2016-05-10: qty 22

## 2016-05-10 MED ORDER — ALUM & MAG HYDROXIDE-SIMETH 200-200-20 MG/5ML PO SUSP: 30.0000 mL | Freq: Four times a day (QID) | ORAL | Status: DC | PRN

## 2016-05-10 MED ORDER — ACETAMINOPHEN 325 MG PO TABS: 650.0000 mg | ORAL_TABLET | ORAL | Status: DC | PRN

## 2016-05-10 MED ORDER — SUCCINYLCHOLINE CHLORIDE 20 MG/ML IJ SOLN
INTRAMUSCULAR | Status: DC | PRN
  Administered 2016-05-10: 100 mg via INTRAVENOUS

## 2016-05-10 MED ORDER — MIDAZOLAM HCL 2 MG/2ML IJ SOLN
INTRAMUSCULAR | Status: AC
  Filled 2016-05-10: qty 2

## 2016-05-10 MED ORDER — PROMETHAZINE HCL 25 MG/ML IJ SOLN: 6.2500 mg | INTRAMUSCULAR | Status: DC | PRN

## 2016-05-10 MED ORDER — SODIUM CHLORIDE 0.9% FLUSH: 3.0000 mL | INTRAVENOUS | Status: DC | PRN

## 2016-05-10 MED ORDER — MEPERIDINE HCL 25 MG/ML IJ SOLN: 6.2500 mg | INTRAMUSCULAR | Status: DC | PRN

## 2016-05-10 MED ORDER — HYDROCODONE-ACETAMINOPHEN 5-325 MG PO TABS: 1.0000 | ORAL_TABLET | ORAL | Status: DC | PRN

## 2016-05-10 MED ORDER — METHOCARBAMOL 500 MG PO TABS
ORAL_TABLET | ORAL | Status: AC
  Filled 2016-05-10: qty 1

## 2016-05-10 MED ORDER — LEVOTHYROXINE SODIUM 100 MCG PO TABS: 100.0000 ug | ORAL_TABLET | Freq: Every day | ORAL | Status: DC

## 2016-05-10 MED ORDER — THROMBIN 5000 UNITS EX SOLR
CUTANEOUS | Status: DC | PRN
  Administered 2016-05-10 (×2): 5000 [IU] via TOPICAL

## 2016-05-10 MED ORDER — FENTANYL CITRATE (PF) 100 MCG/2ML IJ SOLN
INTRAMUSCULAR | Status: AC
  Filled 2016-05-10: qty 2

## 2016-05-10 MED ORDER — OXYCODONE-ACETAMINOPHEN 5-325 MG PO TABS: 1.0000 | ORAL_TABLET | ORAL | Status: DC | PRN

## 2016-05-10 MED ORDER — ACETAMINOPHEN 650 MG RE SUPP: 650.0000 mg | RECTAL | Status: DC | PRN

## 2016-05-10 MED ORDER — GLYCOPYRROLATE 0.2 MG/ML IJ SOLN
INTRAMUSCULAR | Status: DC | PRN
  Administered 2016-05-10 (×2): 0.2 mg via INTRAVENOUS

## 2016-05-10 MED ORDER — FENTANYL CITRATE (PF) 100 MCG/2ML IJ SOLN
INTRAMUSCULAR | Status: DC | PRN
  Administered 2016-05-10: 100 ug via INTRAVENOUS

## 2016-05-10 MED ORDER — SUGAMMADEX SODIUM 200 MG/2ML IV SOLN
INTRAVENOUS | Status: AC
  Filled 2016-05-10: qty 2

## 2016-05-10 MED ORDER — DOCUSATE SODIUM 100 MG PO CAPS: 100.0000 mg | ORAL_CAPSULE | Freq: Two times a day (BID) | ORAL | Status: DC

## 2016-05-10 MED ORDER — ASPIRIN EC 81 MG PO TBEC: 81.0000 mg | DELAYED_RELEASE_TABLET | Freq: Every day | ORAL | Status: DC

## 2016-05-10 MED ORDER — MIDAZOLAM HCL 5 MG/5ML IJ SOLN
INTRAMUSCULAR | Status: DC | PRN
  Administered 2016-05-10: 2 mg via INTRAVENOUS

## 2016-05-10 MED ORDER — HEMOSTATIC AGENTS (NO CHARGE) OPTIME
TOPICAL | Status: DC | PRN
  Administered 2016-05-10: 1 via TOPICAL

## 2016-05-10 MED ORDER — PHENYLEPHRINE 40 MCG/ML (10ML) SYRINGE FOR IV PUSH (FOR BLOOD PRESSURE SUPPORT)
PREFILLED_SYRINGE | INTRAVENOUS | Status: AC
  Filled 2016-05-10: qty 10

## 2016-05-10 MED ORDER — LIDOCAINE HCL (CARDIAC) 20 MG/ML IV SOLN
INTRAVENOUS | Status: DC | PRN
  Administered 2016-05-10: 50 mg via INTRATRACHEAL

## 2016-05-10 MED ORDER — SENNOSIDES-DOCUSATE SODIUM 8.6-50 MG PO TABS: 1.0000 | ORAL_TABLET | Freq: Every evening | ORAL | Status: DC | PRN

## 2016-05-10 MED ORDER — SUGAMMADEX SODIUM 200 MG/2ML IV SOLN
INTRAVENOUS | Status: DC | PRN
  Administered 2016-05-10: 240.4 mg via INTRAVENOUS

## 2016-05-10 MED ORDER — GLYCOPYRROLATE 0.2 MG/ML IV SOSY
PREFILLED_SYRINGE | INTRAVENOUS | Status: AC
  Filled 2016-05-10: qty 3

## 2016-05-10 MED ORDER — LACTATED RINGERS IV SOLN
INTRAVENOUS | Status: DC
  Administered 2016-05-10 (×3): via INTRAVENOUS

## 2016-05-10 MED ORDER — METHYLPREDNISOLONE ACETATE 80 MG/ML IJ SUSP
INTRAMUSCULAR | Status: DC | PRN
  Administered 2016-05-10: 80 mg

## 2016-05-10 MED ORDER — METHOCARBAMOL 1000 MG/10ML IJ SOLN
500.0000 mg | Freq: Four times a day (QID) | INTRAVENOUS | Status: DC | PRN
  Filled 2016-05-10: qty 5

## 2016-05-10 MED ORDER — METHOCARBAMOL 500 MG PO TABS
500.0000 mg | ORAL_TABLET | Freq: Four times a day (QID) | ORAL | Status: DC | PRN
  Administered 2016-05-10: 500 mg via ORAL

## 2016-05-10 MED ORDER — PHENOL 1.4 % MT LIQD
1.0000 | OROMUCOSAL | Status: DC | PRN
Start: 2016-05-10 — End: 2016-05-10

## 2016-05-10 SURGICAL SUPPLY — 54 items
BIT DRILL NEURO 2X3.1 SFT TUCH (MISCELLANEOUS) ×1 IMPLANT
BLADE CLIPPER SURG (BLADE) IMPLANT
BUR ROUND FLUTED 5 RND (BURR) ×2 IMPLANT
BUR ROUND FLUTED 5MM RND (BURR) ×1
CANISTER SUCT 3000ML PPV (MISCELLANEOUS) ×3 IMPLANT
DECANTER SPIKE VIAL GLASS SM (MISCELLANEOUS) ×3 IMPLANT
DERMABOND ADVANCED (GAUZE/BANDAGES/DRESSINGS) ×2
DERMABOND ADVANCED .7 DNX12 (GAUZE/BANDAGES/DRESSINGS) ×1 IMPLANT
DRAPE LAPAROTOMY 100X72X124 (DRAPES) ×3 IMPLANT
DRAPE MICROSCOPE LEICA (MISCELLANEOUS) ×3 IMPLANT
DRAPE POUCH INSTRU U-SHP 10X18 (DRAPES) ×3 IMPLANT
DRAPE SURG 17X23 STRL (DRAPES) ×3 IMPLANT
DRILL NEURO 2X3.1 SOFT TOUCH (MISCELLANEOUS) ×3
DRSG OPSITE POSTOP 3X4 (GAUZE/BANDAGES/DRESSINGS) ×3 IMPLANT
DURAPREP 26ML APPLICATOR (WOUND CARE) ×3 IMPLANT
ELECT BLADE 4.0 EZ CLEAN MEGAD (MISCELLANEOUS) ×3
ELECT REM PT RETURN 9FT ADLT (ELECTROSURGICAL) ×3
ELECTRODE BLDE 4.0 EZ CLN MEGD (MISCELLANEOUS) ×1 IMPLANT
ELECTRODE REM PT RTRN 9FT ADLT (ELECTROSURGICAL) ×1 IMPLANT
GAUZE SPONGE 4X4 12PLY STRL (GAUZE/BANDAGES/DRESSINGS) IMPLANT
GAUZE SPONGE 4X4 16PLY XRAY LF (GAUZE/BANDAGES/DRESSINGS) IMPLANT
GLOVE BIO SURGEON STRL SZ8 (GLOVE) ×3 IMPLANT
GLOVE BIOGEL PI IND STRL 8 (GLOVE) ×1 IMPLANT
GLOVE BIOGEL PI IND STRL 8.5 (GLOVE) ×1 IMPLANT
GLOVE BIOGEL PI INDICATOR 8 (GLOVE) ×2
GLOVE BIOGEL PI INDICATOR 8.5 (GLOVE) ×2
GLOVE ECLIPSE 8.0 STRL XLNG CF (GLOVE) ×3 IMPLANT
GLOVE EXAM NITRILE LRG STRL (GLOVE) IMPLANT
GLOVE EXAM NITRILE MD LF STRL (GLOVE) IMPLANT
GLOVE EXAM NITRILE XL STR (GLOVE) IMPLANT
GLOVE EXAM NITRILE XS STR PU (GLOVE) IMPLANT
GOWN STRL REUS W/ TWL LRG LVL3 (GOWN DISPOSABLE) IMPLANT
GOWN STRL REUS W/ TWL XL LVL3 (GOWN DISPOSABLE) ×1 IMPLANT
GOWN STRL REUS W/TWL 2XL LVL3 (GOWN DISPOSABLE) ×3 IMPLANT
GOWN STRL REUS W/TWL LRG LVL3 (GOWN DISPOSABLE)
GOWN STRL REUS W/TWL XL LVL3 (GOWN DISPOSABLE) ×2
KIT BASIN OR (CUSTOM PROCEDURE TRAY) ×3 IMPLANT
KIT ROOM TURNOVER OR (KITS) ×3 IMPLANT
NEEDLE HYPO 18GX1.5 BLUNT FILL (NEEDLE) ×3 IMPLANT
NEEDLE HYPO 25X1 1.5 SAFETY (NEEDLE) ×3 IMPLANT
NS IRRIG 1000ML POUR BTL (IV SOLUTION) ×3 IMPLANT
PACK LAMINECTOMY NEURO (CUSTOM PROCEDURE TRAY) ×3 IMPLANT
PAD ARMBOARD 7.5X6 YLW CONV (MISCELLANEOUS) ×9 IMPLANT
RUBBERBAND STERILE (MISCELLANEOUS) ×6 IMPLANT
SLEEVE SURGEON STRL (DRAPES) ×3 IMPLANT
SPONGE SURGIFOAM ABS GEL SZ50 (HEMOSTASIS) ×3 IMPLANT
SUT VIC AB 0 CT1 18XCR BRD8 (SUTURE) ×1 IMPLANT
SUT VIC AB 0 CT1 8-18 (SUTURE) ×2
SUT VIC AB 2-0 CT1 18 (SUTURE) ×3 IMPLANT
SUT VIC AB 3-0 SH 8-18 (SUTURE) ×3 IMPLANT
SYR 5ML LL (SYRINGE) ×3 IMPLANT
TOWEL OR 17X24 6PK STRL BLUE (TOWEL DISPOSABLE) ×3 IMPLANT
TOWEL OR 17X26 10 PK STRL BLUE (TOWEL DISPOSABLE) ×3 IMPLANT
WATER STERILE IRR 1000ML POUR (IV SOLUTION) ×3 IMPLANT

## 2016-05-10 NOTE — Anesthesia Postprocedure Evaluation (Signed)
Anesthesia Post Note  Patient: Stephen Barnett  Procedure(s) Performed: Procedure(s) (LRB): MICRODISCECTOMY RIGHT LUMBAR THREE-LUMBAR FOUR (N/A)  Patient location during evaluation: PACU Anesthesia Type: General Level of consciousness: awake and alert Pain management: pain level controlled Vital Signs Assessment: post-procedure vital signs reviewed and stable Respiratory status: spontaneous breathing, nonlabored ventilation, respiratory function stable and patient connected to nasal cannula oxygen Cardiovascular status: blood pressure returned to baseline and stable Postop Assessment: no signs of nausea or vomiting Anesthetic complications: no    Last Vitals:  Vitals:   05/10/16 1305 05/10/16 1653  BP: 132/80 (!) 147/98  Pulse: 68 70  Resp: 16 16  Temp: 36.6 C 36.6 C    Last Pain:  Vitals:   05/10/16 1236  TempSrc:   PainSc: 2                  Effie Berkshire

## 2016-05-10 NOTE — Progress Notes (Signed)
Awake, alert, conversant.  Full strength, pain relieved.  No numbness.  Doing well.

## 2016-05-10 NOTE — Progress Notes (Signed)
Patient alert and oriented, mae's well, voiding adequate amount of urine, swallowing without difficulty. Patient discharged home with family. Discharged instructions given to patient. Patient and family stated understanding of instructions given.

## 2016-05-10 NOTE — Anesthesia Procedure Notes (Signed)
Procedure Name: Intubation Date/Time: 05/10/2016 9:53 AM Performed by: Lavell Luster Pre-anesthesia Checklist: Patient identified, Emergency Drugs available, Suction available, Patient being monitored and Timeout performed Patient Re-evaluated:Patient Re-evaluated prior to inductionOxygen Delivery Method: Circle system utilized Preoxygenation: Pre-oxygenation with 100% oxygen Intubation Type: IV induction Ventilation: Mask ventilation without difficulty Laryngoscope Size: Mac, 4 and Glidescope Grade View: Grade I Tube type: Oral Tube size: 7.5 mm Number of attempts: 1 Airway Equipment and Method: Stylet and Video-laryngoscopy Placement Confirmation: ETT inserted through vocal cords under direct vision,  positive ETCO2 and breath sounds checked- equal and bilateral Secured at: 22 cm Tube secured with: Tape Dental Injury: Teeth and Oropharynx as per pre-operative assessment  Difficulty Due To: Difficulty was anticipated Future Recommendations: Recommend- induction with short-acting agent, and alternative techniques readily available

## 2016-05-10 NOTE — Progress Notes (Signed)
Patient ID: Stephen Barnett, male   DOB: [birth year], 70 y.o.   MRN: [id number] Alert, conversant, smiling. Reports mild incisional discomfort, no leg pain. Incision is flat, without erythema or drainage beneath honeycomb & Dermabond. Full strength BLE. Pt hopeful of d/c to home today.  Per Dr.Stern, ok to d/c IV, d/c to home. Pt has f/u visit already scheduled at office in 3-4 weeks. He has Percocet at home for prn use. Robaxin eRx'ed by Dr.Stern prn spasm. Pt verbalizes understanding of d/c instructions.   Verdis Prime RN BSN

## 2016-05-10 NOTE — Discharge Instructions (Signed)

## 2016-05-10 NOTE — Transfer of Care (Signed)
Immediate Anesthesia Transfer of Care Note  Patient: Stephen Barnett  Procedure(s) Performed: Procedure(s) with comments: MICRODISCECTOMY RIGHT LUMBAR THREE-LUMBAR FOUR (N/A) - MICRODISCECTOMY RIGHT L3-L4  Patient Location: PACU  Anesthesia Type:General  Level of Consciousness: awake, alert  and oriented  Airway & Oxygen Therapy: Patient connected to face mask oxygen  Post-op Assessment: Post -op Vital signs reviewed and stable  Post vital signs: stable  Last Vitals:  Vitals:   05/10/16 0722 05/10/16 1142  BP: (!) 157/90 (!) 86/66  Pulse: 88   Resp: 20 (!) 21  Temp: 36.8 C 36.9 C    Last Pain:  Vitals:   05/10/16 1142  TempSrc:   PainSc: (P) Asleep      Patients Stated Pain Goal: 5 ([id number])  Complications: No apparent anesthesia complications

## 2016-05-10 NOTE — H&P (Signed)
  Patient ID:   [id number] Patient: Stephen Barnett  Date of Birth: [birth year] Visit Type: Office Visit   Date: 05/08/2016 11:15 AM Provider: Marchia Meiers. Vertell Limber MD   This 70 year old male presents for back pain.  History of Present Illness: 1.  back pain    Patient returns to review his MRI. He continues to have significant low back and right leg pain.   05/02/16 MRI: Mild spinal stenosis L2-3 is unchanged. Large extruded disc fragment likely with associated epidural hemotoma on the right at L3-4. Cranial migration of disc and hematoma compresses thecal sac causing severe spinal stenosis. In addition, there is disc bulging and facet hypertrophy contributing to severe spinal stenosis. Interval laminotomy on the right at L4-5. Improvement in central disc protrusion and spinal stenosis. There remains subarticular stenosis which is severe on the left and moderate on the right which likely causes impingement of the L5 nerve roots bilaterally. Moderate spinal stenosis remains at this level.      Medical/Surgical/Interim History Reviewed, no change.  Last detailed document date:07/24/2015.   PAST MEDICAL HISTORY, SURGICAL HISTORY, FAMILY HISTORY, SOCIAL HISTORY AND REVIEW OF SYSTEMS  07/24/2015, which I have signed.  Family History: Reviewed, no changes.  Last detailed document: 07/24/2015.   Social History: Reviewed, no changes. Last detailed document date: 07/24/2015.      MEDICATIONS(added, continued or stopped this visit): Started Medication Directions Instruction Stopped   levothyroxine  BUCCAL 1 tablet by mouth daily.    04/02/2016 Medrol (Pak) 4 mg tablets in a dose pack take by Oral route as directed    04/02/2016 Neurontin 300 mg capsule take 2 capsule by oral route 2 times every day    04/17/2016 Percocet 5 mg-325 mg tablet take 1 tablet by oral route 2 times every day as needed       ALLERGIES: Ingredient Reaction Medication Name Comment  NO KNOWN ALLERGIES     No known  allergies.    Vitals Date Temp F BP Pulse Ht In Wt Lb BMI BSA Pain Score  05/08/2016  182/83 63 74 274 35.18  10/10      IMPRESSION The patient's MRI reveals a large extruded disc fragment, disc bulging, and facet hypertrophy at L3-4 on the right, which is causing severe spinal stenosis and narrowing. He also has a central disc protrusion and moderate stenosis at L3-4 on the left, but he is only having right-sided symptoms. Therefore, I recommend a right L3-4 microdiscectomy for alleviation of his low back and right leg symptoms.   Assessment/Plan # Detail Type Description   1. Assessment HNP (herniated nucleus pulposus), lumbar (M51.26).         Pain Assessment/Treatment Pain Scale: 10/10. Method: Numeric Pain Intensity Scale. Location: back. Onset: 05/13/2015. Duration: varies. Quality: discomforting. Pain Assessment/Treatment follow-up plan of care: Patient taking medications as prescribed..  Fall Risk Plan The patient has not fallen in the last year.  Schedule right L3-4 microdiscectomy. Nurse education given.  We discussed the importance of weight loss and general physical conditioning.              Provider:  Marchia Meiers. Vertell Limber MD  05/08/2016 11:35 AM Dictation edited by: Johnella Moloney    CC Providers: [facility] [address] [place], [place] [phone]-              Electronically signed by Marchia Meiers Vertell Limber MD on 05/08/2016 05:41 PM

## 2016-05-10 NOTE — Discharge Summary (Signed)
Physician Discharge Summary  Patient ID: [name] MRN: [id number] DOB/AGE: [birth year] 70 y.o.  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  Following uncomplicated surgery, he recovered well and transferred to [facility] for observation. He has mobilized nicely.  Consults: None  Significant Diagnostic Studies: radiology: X-Ray: intra-op  Treatments: surgery: MICRODISCECTOMY RIGHT LUMBAR THREE-LUMBAR FOUR (N/A) - MICRODISCECTOMY RIGHT L3-L4 with microdissection     Discharge Exam: Blood pressure 132/80, pulse 68, temperature 97.8 F (36.6 C), resp. rate 16, height 6' 1.5" (1.867 m), weight 120.2 kg (265 lb), SpO2 98 %. Alert, conversant, smiling. Reports mild incisional discomfort, no leg pain. Incision is flat, without erythema or drainage beneath honeycomb & Dermabond. Full strength BLE. Pt hopeful of d/c to home today.    Disposition: 01-Home or Self Care Per Dr.Joas Motton, ok to d/c IV, d/c to home. Pt has f/u visit already scheduled at office in 3-4 weeks. He has Percocet at home for prn use. Robaxin eRx'ed by Dr.Tanmay Halteman 500mg  I po q8hrs prn spasm #60. Pt verbalizes understanding of d/c instructions.          Signed: Verdis Prime 05/10/2016, 4:12 PM   Patient is doing well.  Discharge home.

## 2016-05-10 NOTE — Anesthesia Preprocedure Evaluation (Signed)
Anesthesia Evaluation  Patient identified by MRN, date of birth, ID band Patient awake    Reviewed: Allergy & Precautions, NPO status , Patient's Chart, lab work & pertinent test results  Airway Mallampati: III  TM Distance: >3 FB Neck ROM: Full    Dental  (+) Teeth Intact, Dental Advisory Given   Pulmonary former smoker,    breath sounds clear to auscultation       Cardiovascular negative cardio ROS   Rhythm:Regular Rate:Normal     Neuro/Psych  Neuromuscular disease negative psych ROS   GI/Hepatic negative GI ROS, Neg liver ROS,   Endo/Other  negative endocrine ROS  Renal/GU negative Renal ROS  negative genitourinary   Musculoskeletal  (+) Arthritis , Osteoarthritis,    Abdominal   Peds negative pediatric ROS (+)  Hematology negative hematology ROS (+)   Anesthesia Other Findings   Reproductive/Obstetrics negative OB ROS                             Anesthesia Physical Anesthesia Plan  ASA: II  Anesthesia Plan: General   Post-op Pain Management:    Induction: Intravenous  Airway Management Planned: Oral ETT  Additional Equipment:   Intra-op Plan:   Post-operative Plan: Extubation in OR  Informed Consent: I have reviewed the patients History and Physical, chart, labs and discussed the procedure including the risks, benefits and alternatives for the proposed anesthesia with the patient or authorized representative who has indicated his/her understanding and acceptance.   Dental advisory given  Plan Discussed with: CRNA  Anesthesia Plan Comments: (Glidescope 2/2 cervical radiculopathy)        Anesthesia Quick Evaluation

## 2016-05-10 NOTE — Brief Op Note (Signed)
05/10/2016  11:53 AM  PATIENT:  Stephen Barnett  70 y.o. 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, stenosis, lumbago, radiculopathy  PROCEDURE:  Procedure(s) with comments: MICRODISCECTOMY RIGHT LUMBAR THREE-LUMBAR FOUR (N/A) - MICRODISCECTOMY RIGHT L3-L4 with microdissection  SURGEON:  Surgeon(s) and Role:    * Erline Levine, MD - Primary    * Earnie Larsson, MD - Assisting  PHYSICIAN ASSISTANT:   ASSISTANTS: Poteat, RN   ANESTHESIA:   general  EBL:  Total I/O In: 1000 [I.V.:1000] Out: 150 [Blood:150]  BLOOD ADMINISTERED:none  DRAINS: none   LOCAL MEDICATIONS USED:  LIDOCAINE   SPECIMEN:  No Specimen  DISPOSITION OF SPECIMEN:  N/A  COUNTS:  YES  TOURNIQUET:  * No tourniquets in log *  DICTATION: DICTATION: Patient has a large L 34 disc rupture on the right with significant right leg weakness. It was elected to take him to surgery for right L 34 microdiscectomy.  Procedure: Patient was brought to the operating room and following the smooth and uncomplicated induction of general endotracheal anesthesia he was placed in a prone position on the Wilson frame. Low back was prepped and draped in the usual sterile fashion with betadine scrub and DuraPrep.  Area of planned incision was infiltrated with local lidocaine. Incision was made in the midline and carried to the lumbodorsal fascia which was incised on the right side of midline. Subperiosteal dissection was performed exposing what was felt to be L 34 level. Intraoperative x-ray demonstrated marker probe at L3-4.  A hemi-semi-laminectomy of L 3 was performed a high-speed drill and completed with Kerrison rongeurs and a hemillaminectomy of L 3 lamina was performed. Ligamentum flavum was detached and removed in a piecemeal fashion and the L 3 nerve root was  decompressed laterally with removal of the superior aspect of the facet and ligamentum causing nerve root compression. The microscope was brought into the field and the L 3 nerve root was exposed. This exposed a large amount of soft disc material and a superiorly migrated free fragment of herniated disc material. Multiple fragments were removed  With decompression of thecal sac and nerve root. It was elected to not further decompress the interspace as the disc at this level appeared intact. There was evidence on the MRI of cephalad fragments and these were carefully removed and the L 3 nerve root was also decompressed. At this point it was felt that all neural elements were well decompressed and there was no evidence of residual loose disc material within the interspace. Hemostasis was assured with bipolar electrocautery and the interspace was irrigated with Depo-Medrol and fentanyl. The lumbodorsal fascia was closed with 0 Vicryl sutures the subcutaneous tissues reapproximated 2-0 Vicryl inverted sutures and the skin edges were reapproximated with 3-0 Vicryl subcuticular stitch. The wound is dressed with Dermabond and an occlusive dressing. Patient was extubated in the operating room and taken to recovery in stable and satisfactory condition having tolerated his operation well counts were correct at the end of the case.   PLAN OF CARE: Admit to inpatient   PATIENT DISPOSITION:  PACU - hemodynamically stable.   Delay start of Pharmacological VTE agent (>24hrs) due to surgical blood loss or risk of bleeding: yes

## 2016-05-10 NOTE — Op Note (Signed)
05/10/2016  11:53 AM  PATIENT:  Stephen Barnett  70 y.o. 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, stenosis, lumbago, radiculopathy  PROCEDURE:  Procedure(s) with comments: MICRODISCECTOMY RIGHT LUMBAR THREE-LUMBAR FOUR (N/A) - MICRODISCECTOMY RIGHT L3-L4 with microdissection  SURGEON:  Surgeon(s) and Role:    * Erline Levine, MD - Primary    * Earnie Larsson, MD - Assisting  PHYSICIAN ASSISTANT:   ASSISTANTS: Poteat, RN   ANESTHESIA:   general  EBL:  Total I/O In: 1000 [I.V.:1000] Out: 150 [Blood:150]  BLOOD ADMINISTERED:none  DRAINS: none   LOCAL MEDICATIONS USED:  LIDOCAINE   SPECIMEN:  No Specimen  DISPOSITION OF SPECIMEN:  N/A  COUNTS:  YES  TOURNIQUET:  * No tourniquets in log *  DICTATION: DICTATION: Patient has a large L 34 disc rupture on the right with significant right leg weakness. It was elected to take him to surgery for right L 34 microdiscectomy.  Procedure: Patient was brought to the operating room and following the smooth and uncomplicated induction of general endotracheal anesthesia he was placed in a prone position on the Wilson frame. Low back was prepped and draped in the usual sterile fashion with betadine scrub and DuraPrep.  Area of planned incision was infiltrated with local lidocaine. Incision was made in the midline and carried to the lumbodorsal fascia which was incised on the right side of midline. Subperiosteal dissection was performed exposing what was felt to be L 34 level. Intraoperative x-ray demonstrated marker probe at L3-4.  A hemi-semi-laminectomy of L 3 was performed a high-speed drill and completed with Kerrison rongeurs and a hemillaminectomy of L 3 lamina was performed. Ligamentum flavum was detached and removed in a piecemeal fashion and the L 3 nerve root was  decompressed laterally with removal of the superior aspect of the facet and ligamentum causing nerve root compression. The microscope was brought into the field and the L 3 nerve root was exposed. This exposed a large amount of soft disc material and a superiorly migrated free fragment of herniated disc material. Multiple fragments were removed  With decompression of thecal sac and nerve root. It was elected to not further decompress the interspace as the disc at this level appeared intact. There was evidence on the MRI of cephalad fragments and these were carefully removed and the L 3 nerve root was also decompressed. At this point it was felt that all neural elements were well decompressed and there was no evidence of residual loose disc material within the interspace. Hemostasis was assured with bipolar electrocautery and the interspace was irrigated with Depo-Medrol and fentanyl. The lumbodorsal fascia was closed with 0 Vicryl sutures the subcutaneous tissues reapproximated 2-0 Vicryl inverted sutures and the skin edges were reapproximated with 3-0 Vicryl subcuticular stitch. The wound is dressed with Dermabond and an occlusive dressing. Patient was extubated in the operating room and taken to recovery in stable and satisfactory condition having tolerated his operation well counts were correct at the end of the case.   PLAN OF CARE: Admit to inpatient   PATIENT DISPOSITION:  PACU - hemodynamically stable.   Delay start of Pharmacological VTE agent (>24hrs) due to surgical blood loss or risk of bleeding: yes

## 2016-05-10 NOTE — Interval H&P Note (Signed)
History and Physical Interval Note:  05/10/2016 7:40 AM  Stephen Barnett  has presented today for surgery, with the diagnosis of HERNIATED NEUCLEUS PULPOSUS, LUMBAR  The various methods of treatment have been discussed with the patient and family. After consideration of risks, benefits and other options for treatment, the patient has consented to  Procedure(s) with comments: MICRODISCECTOMY RIGHT L3-L4 (N/A) - MICRODISCECTOMY RIGHT L3-L4 as a surgical intervention .  The patient's history has been reviewed, patient examined, no change in status, stable for surgery.  I have reviewed the patient's chart and labs.  Questions were answered to the patient's satisfaction.     Marvene Strohm D

## 2016-05-13 ENCOUNTER — Encounter (HOSPITAL_COMMUNITY): Payer: Self-pay | Admitting: Neurosurgery

## 2016-11-06 DIAGNOSIS — M545 Low back pain: Secondary | ICD-10-CM | POA: Diagnosis not present

## 2016-11-06 DIAGNOSIS — M5416 Radiculopathy, lumbar region: Secondary | ICD-10-CM | POA: Diagnosis not present

## 2016-11-06 DIAGNOSIS — M5126 Other intervertebral disc displacement, lumbar region: Secondary | ICD-10-CM | POA: Diagnosis not present

## 2016-11-06 DIAGNOSIS — Z6835 Body mass index (BMI) 35.0-35.9, adult: Secondary | ICD-10-CM | POA: Diagnosis not present

## 2017-01-13 DIAGNOSIS — R739 Hyperglycemia, unspecified: Secondary | ICD-10-CM | POA: Diagnosis not present

## 2017-01-13 DIAGNOSIS — E039 Hypothyroidism, unspecified: Secondary | ICD-10-CM | POA: Diagnosis not present

## 2017-01-13 DIAGNOSIS — E7800 Pure hypercholesterolemia, unspecified: Secondary | ICD-10-CM | POA: Diagnosis not present

## 2017-01-13 DIAGNOSIS — Z1389 Encounter for screening for other disorder: Secondary | ICD-10-CM | POA: Diagnosis not present

## 2017-01-13 DIAGNOSIS — Z0001 Encounter for general adult medical examination with abnormal findings: Secondary | ICD-10-CM | POA: Diagnosis not present

## 2017-01-13 DIAGNOSIS — Z6835 Body mass index (BMI) 35.0-35.9, adult: Secondary | ICD-10-CM | POA: Diagnosis not present

## 2017-01-13 DIAGNOSIS — Z125 Encounter for screening for malignant neoplasm of prostate: Secondary | ICD-10-CM | POA: Diagnosis not present

## 2017-01-13 DIAGNOSIS — E663 Overweight: Secondary | ICD-10-CM | POA: Diagnosis not present

## 2017-01-17 DIAGNOSIS — R7301 Impaired fasting glucose: Secondary | ICD-10-CM | POA: Diagnosis not present

## 2017-07-23 DIAGNOSIS — M5416 Radiculopathy, lumbar region: Secondary | ICD-10-CM | POA: Diagnosis not present

## 2017-07-23 DIAGNOSIS — M545 Low back pain: Secondary | ICD-10-CM | POA: Diagnosis not present

## 2017-07-23 DIAGNOSIS — M5126 Other intervertebral disc displacement, lumbar region: Secondary | ICD-10-CM | POA: Diagnosis not present

## 2018-01-22 DIAGNOSIS — Z1389 Encounter for screening for other disorder: Secondary | ICD-10-CM | POA: Diagnosis not present

## 2018-01-22 DIAGNOSIS — Z0000 Encounter for general adult medical examination without abnormal findings: Secondary | ICD-10-CM | POA: Diagnosis not present

## 2018-01-22 DIAGNOSIS — E039 Hypothyroidism, unspecified: Secondary | ICD-10-CM | POA: Diagnosis not present

## 2018-01-22 DIAGNOSIS — Z125 Encounter for screening for malignant neoplasm of prostate: Secondary | ICD-10-CM | POA: Diagnosis not present

## 2018-01-22 DIAGNOSIS — E7800 Pure hypercholesterolemia, unspecified: Secondary | ICD-10-CM | POA: Diagnosis not present

## 2018-01-30 DIAGNOSIS — R739 Hyperglycemia, unspecified: Secondary | ICD-10-CM | POA: Diagnosis not present

## 2018-03-04 IMAGING — MR MR LUMBAR SPINE WO/W CM
4 of 7 series · 25 of 48 positions shown · IV contrast (multihance)
Comparison: Lumbar MRI 06/12/2015

CLINICAL DATA: Lumbar radiculopathy.  Back pain with right leg pain

Creatinine was obtained on site at [HOSPITAL] at [HOSPITAL].
Results: Creatinine 1.0 mg/dL.
EXAM:
MRI LUMBAR SPINE WITHOUT AND WITH CONTRAST
TECHNIQUE: Multiplanar and multiecho pulse sequences of the lumbar spine were
obtained without and with intravenous contrast.
CONTRAST:  20mL MULTIHANCE GADOBENATE DIMEGLUMINE 529 MG/ML IV SOLN

[Series 4: T1 · sagittal · 4.0mm · 0.59mm/px · 5 of 15 slices shown (1 of 2)]
[im 1/15]
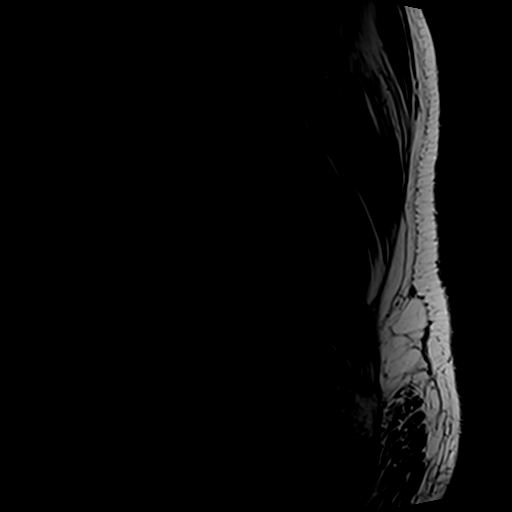
[im 4/15]
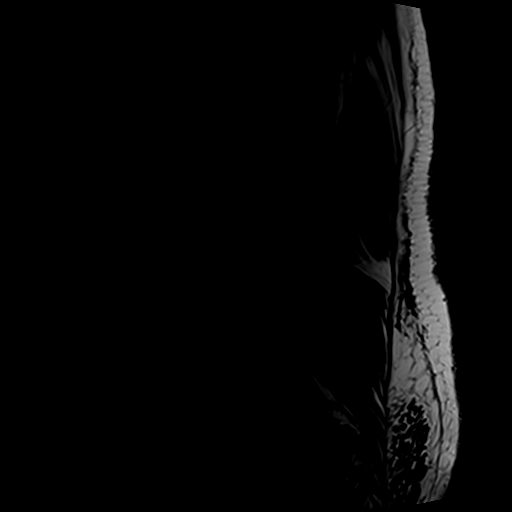
[im 8/15]
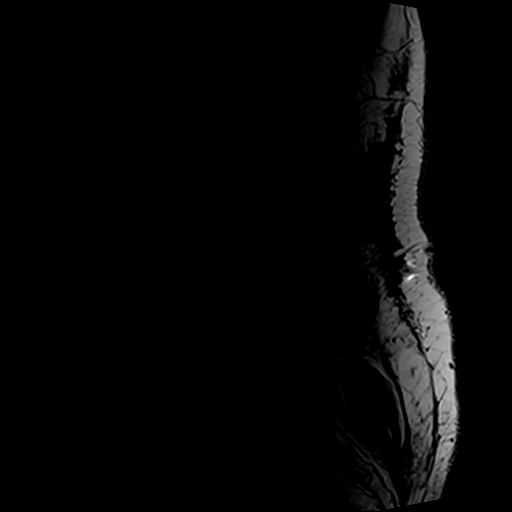
[im 11/15]
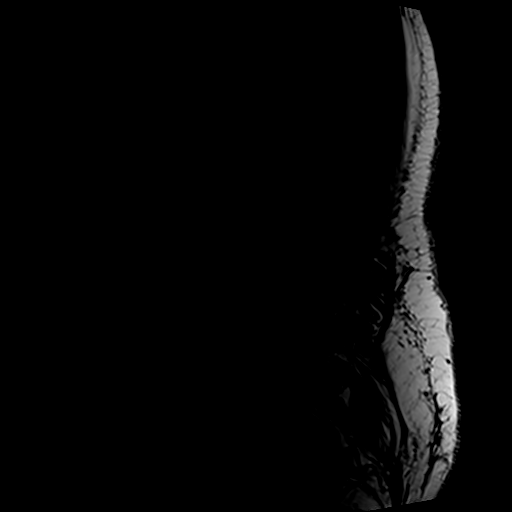
[im 15/15]
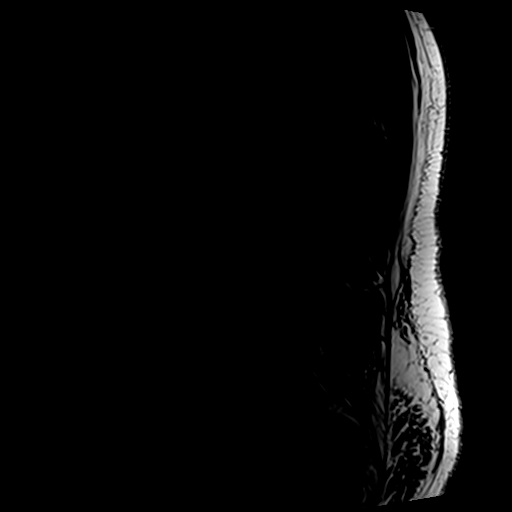

[Series 5: T2 post-contrast · sagittal · 4.0mm · 0.59mm/px · 4 of 15 slices shown]
[im 1/15]
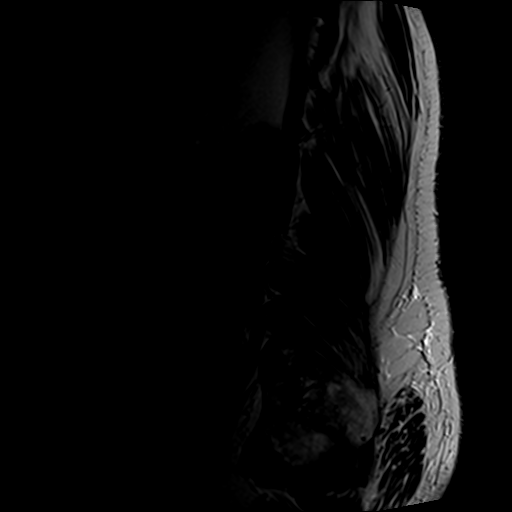
[im 5/15]
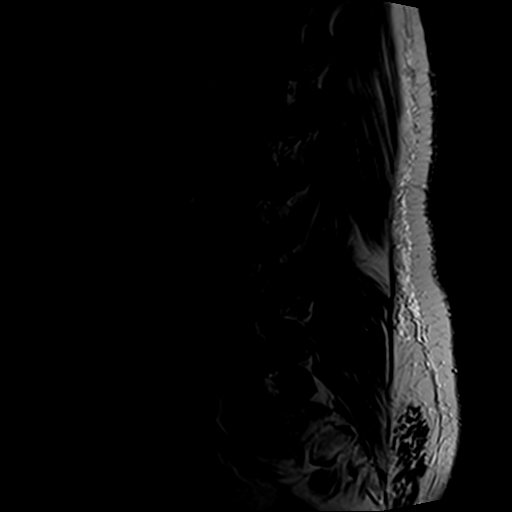
[im 10/15]
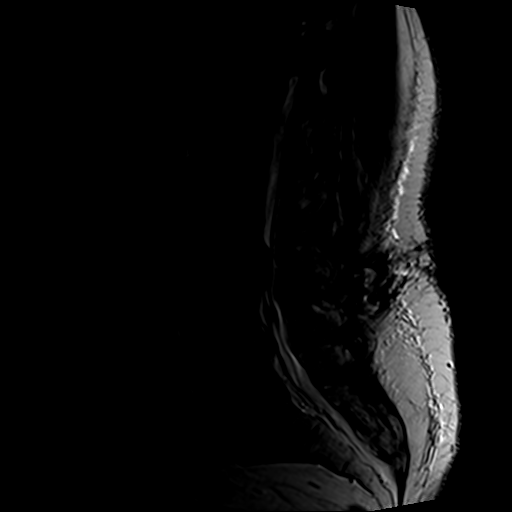
[im 15/15]
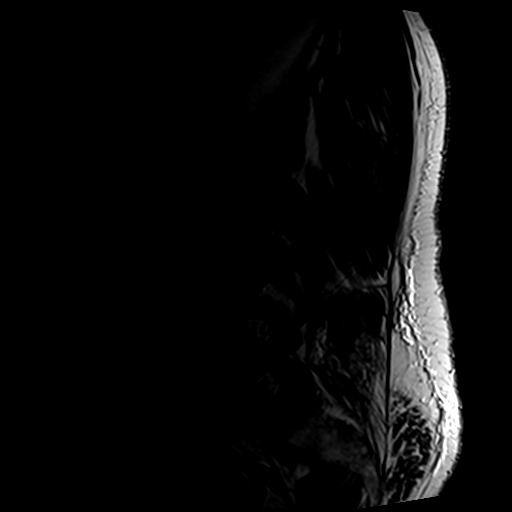

[Series 6: T2 · axial · 4.0mm · 0.78mm/px · z∈[-120,+76]mm · 8 of 34 slices shown]
[im 1/34]
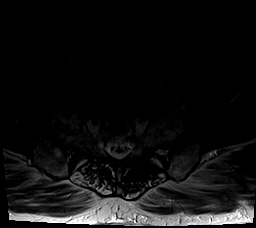
[im 4/34]
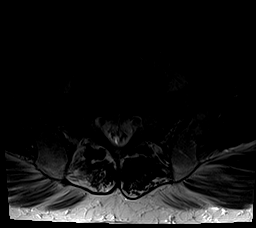
[im 12/34]
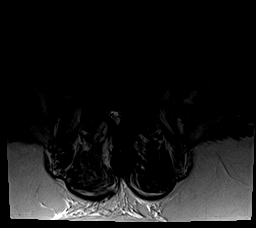
[im 15/34]
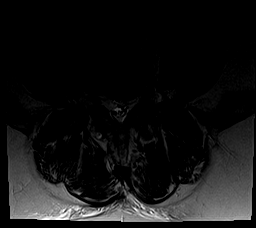
[im 19/34]
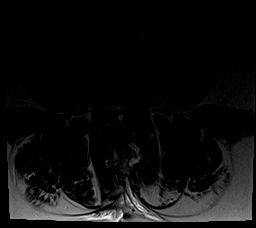
[im 23/34]
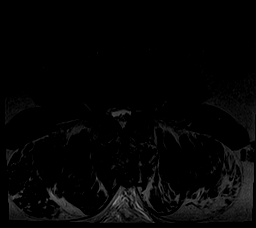
[im 30/34]
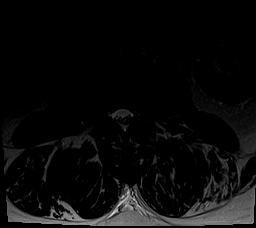
[im 34/34]
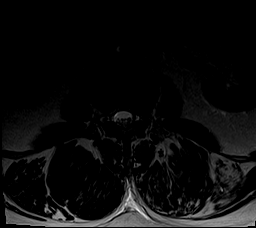

[Series 7: T1 · axial · 4.0mm · 0.39mm/px · z∈[-120,+76]mm · 8 of 34 slices shown (2 of 2)]
[im 1/34]
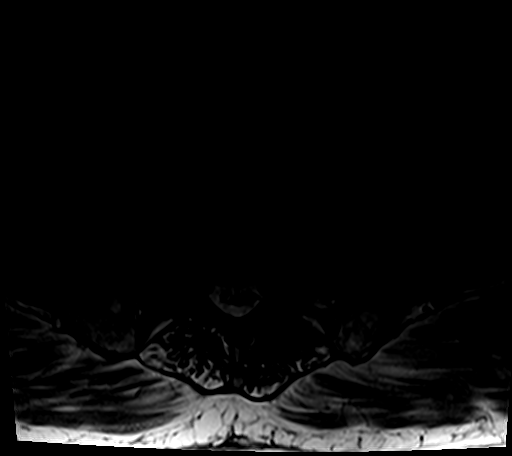
[im 4/34]
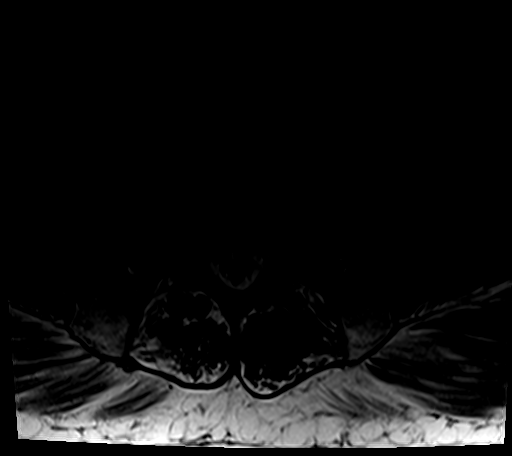
[im 12/34]
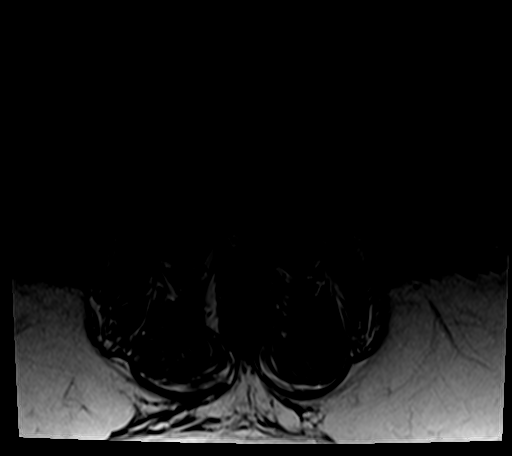
[im 15/34]
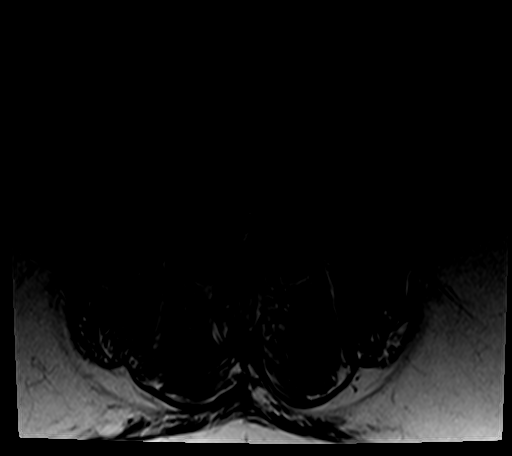
[im 19/34]
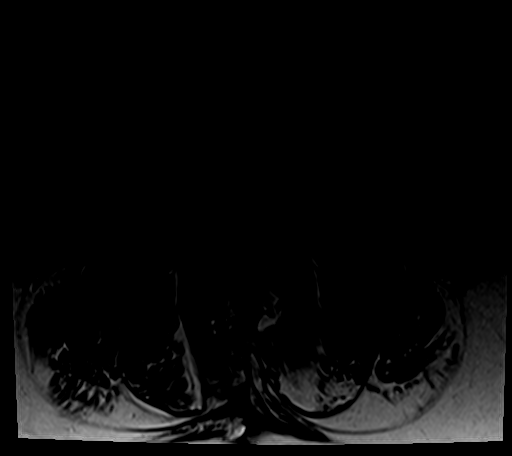
[im 23/34]
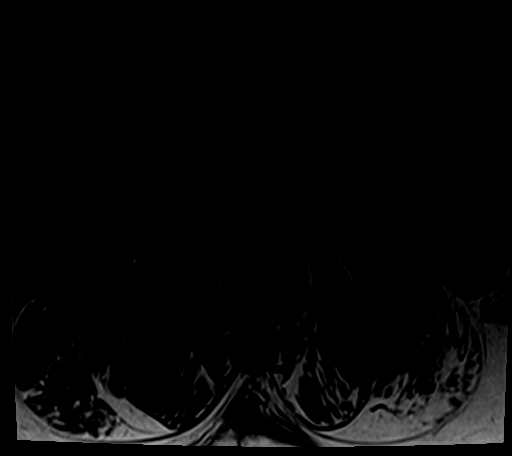
[im 30/34]
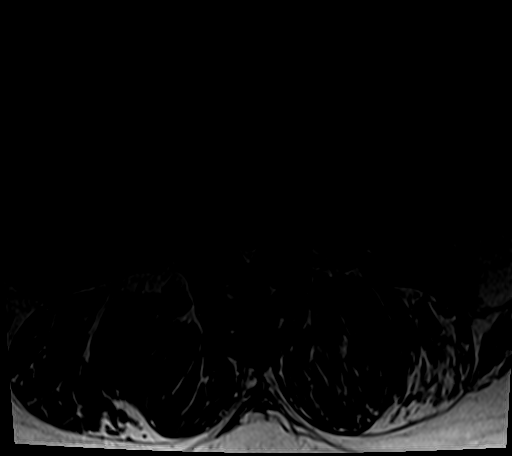
[im 34/34]
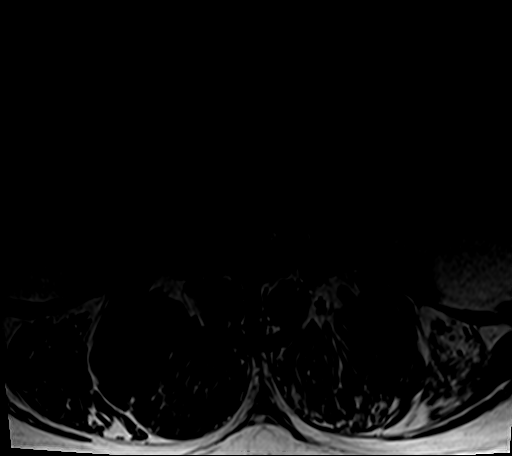

[25 of 48 positions shown; findings below may reference images not displayed]

FINDINGS: Segmentation:  Normal segmentation.  Lowest disc space L5-S1

Alignment:  Normal alignment.

Vertebrae: Negative for fracture or mass. No evidence of fluid
collection or infection. Expected enhancement related to prior
laminectomy on the right at L4-5. No pathologic enhancement.

Conus medullaris: Extends to the T12-L1 level and appears normal.

Paraspinal and other soft tissues: Paraspinous muscles normal. No
retroperitoneal mass or adenopathy.

Disc levels:

L1-2:  Negative

L2-3: Disc bulging and mild facet degeneration. Mild spinal stenosis
unchanged

L3-4: Extruded disc fragment on the right with cranial migration of
disc material. Some of this is high signal on T2 and may represent
epidural hematoma associated with disc protrusion. This was not
present previously. This is compressing the thecal sac and
contributing to severe spinal stenosis. In addition, there is
diffuse disc bulging and facet hypertrophy.

L4-5: Interval laminotomy on the right with decompression of the
canal. Central disc protrusion has improved. There remains a small
central disc protrusion. There is subarticular stenosis bilaterally
left greater than right with expected impingement of the L5 nerve
root bilaterally. L4 nerve root exits without significant
impingement. Moderate spinal stenosis. Bilateral facet degeneration

L5-S1:  Mild facet degeneration without stenosis
IMPRESSION: Mild spinal stenosis L2-3 is unchanged

Large extruded disc fragment likely with associated epidural
hematoma on the right at L3-4. Cranial migration of disc and
hematoma compresses thecal sac causing severe spinal stenosis. In
addition, there is disc bulging and facet hypertrophy contributing
to severe spinal stenosis.

Interval laminotomy on the right at L4-5. Improvement in central
disc protrusion and spinal stenosis. There remains subarticular
stenosis which is severe on the left and moderate on the right which
likely causes impingement of the L5 nerve roots bilaterally.
Moderate spinal stenosis remains at this level.

## 2018-03-12 IMAGING — CR DG LUMBAR SPINE 2-3V
2 series · 2 of 2 positions shown · non-contrast
Comparison: MRI 05/02/2016

CLINICAL DATA: Elective surgery

EXAM:
LUMBAR SPINE - 2-3 VIEW

[lat (1 of 2)]
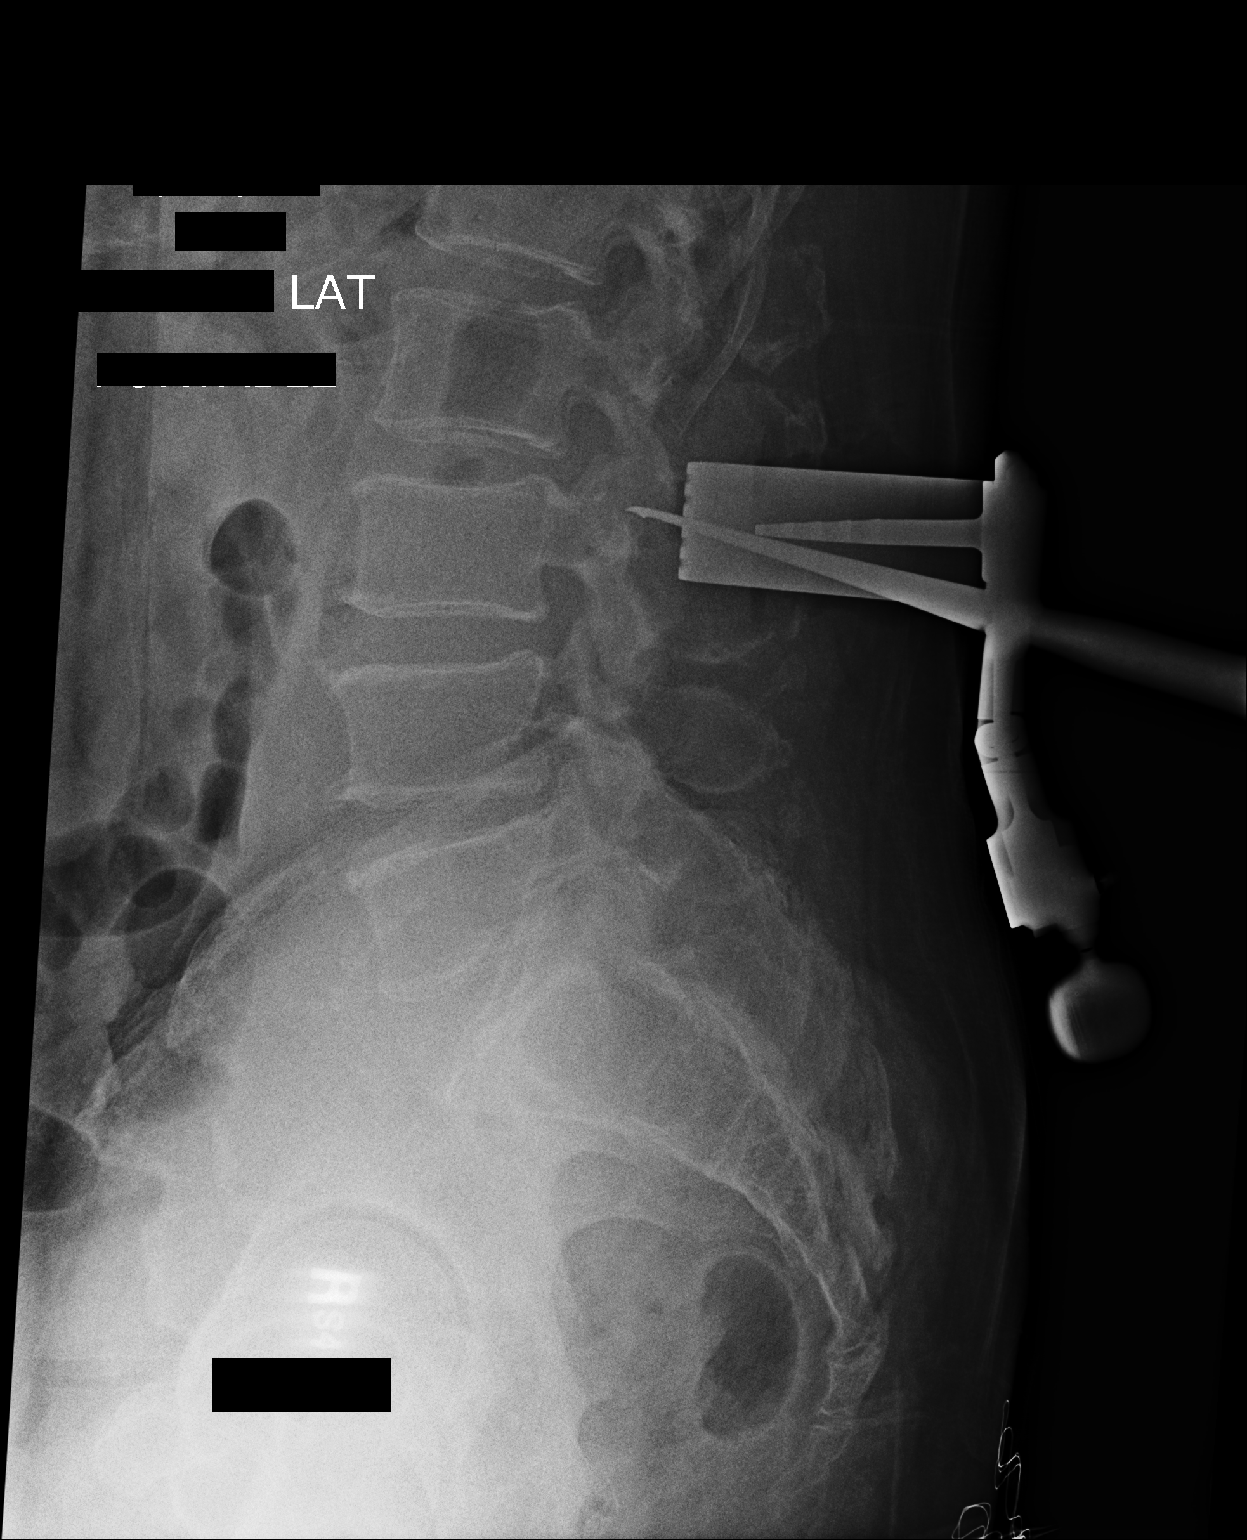

[lat (2 of 2)]
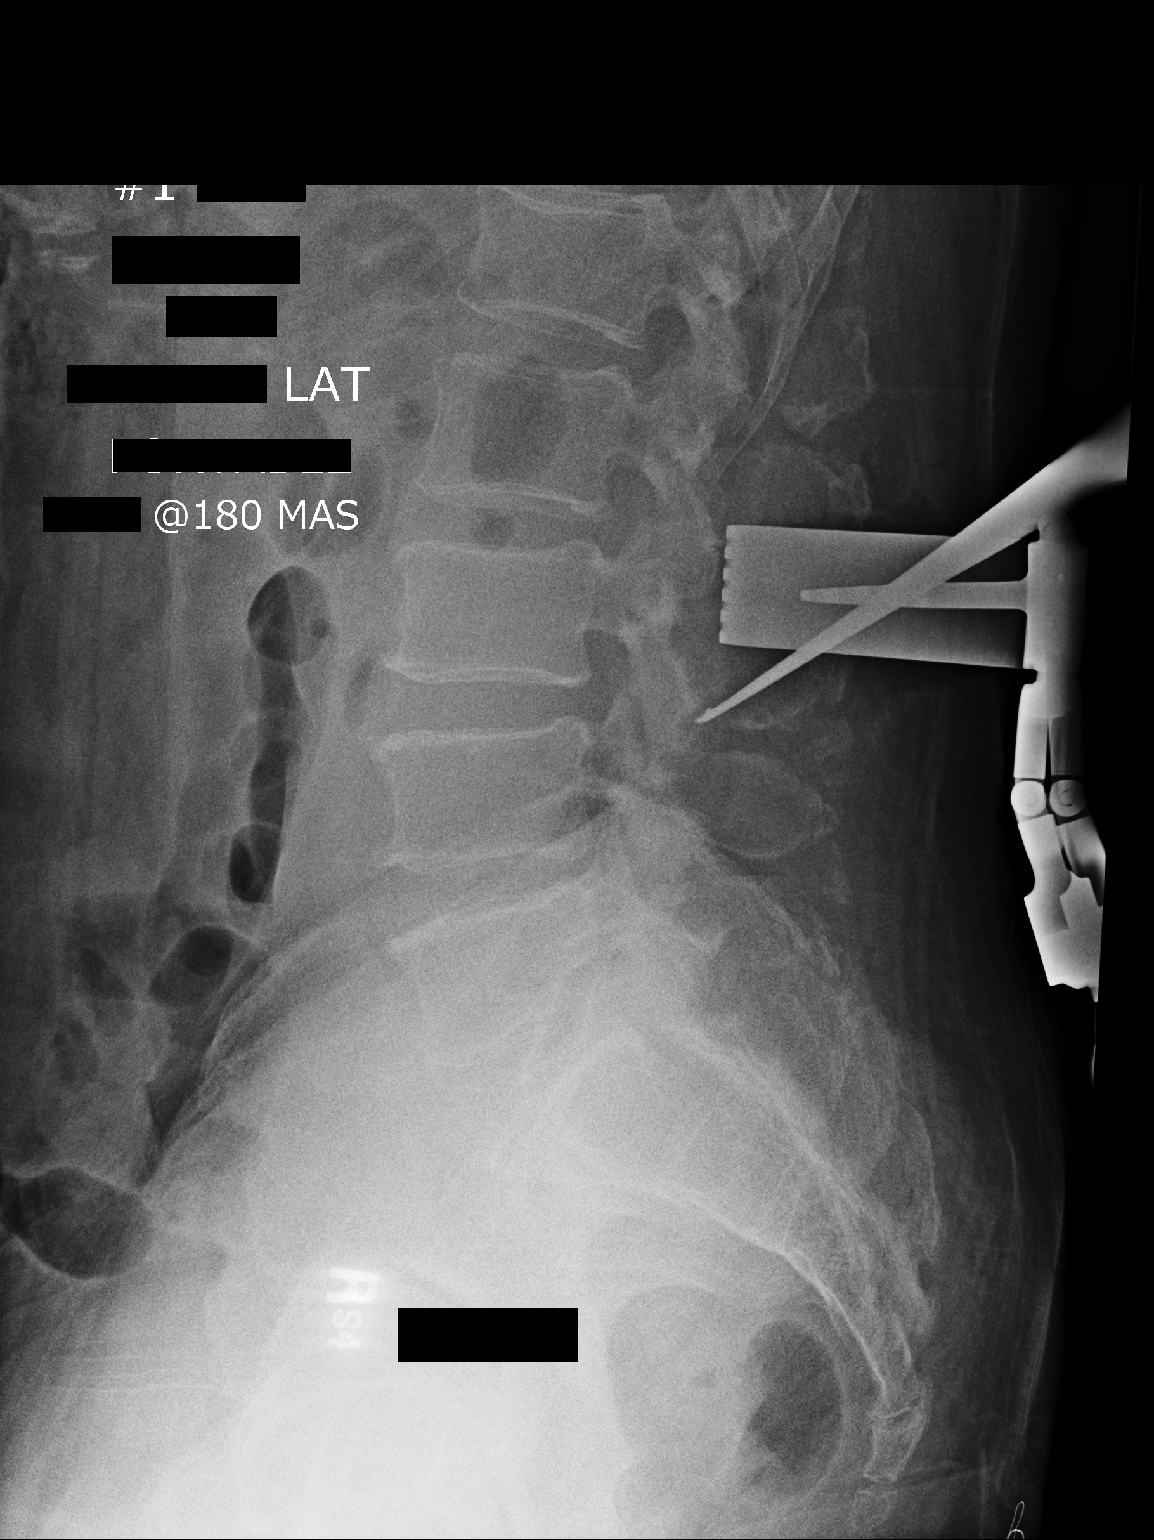

[2 of 2 positions shown; findings below may reference images not displayed]

FINDINGS: First lateral intraoperative image demonstrates posterior surgical
instruments directed at the L2-3 level.

Second lateral intraoperative image demonstrates posterior surgical
instruments at the L3-4 level.
IMPRESSION: Intraoperative localization as above.

## 2018-11-27 DIAGNOSIS — R739 Hyperglycemia, unspecified: Secondary | ICD-10-CM | POA: Diagnosis not present

## 2018-11-27 DIAGNOSIS — Z23 Encounter for immunization: Secondary | ICD-10-CM | POA: Diagnosis not present

## 2019-01-13 DIAGNOSIS — E039 Hypothyroidism, unspecified: Secondary | ICD-10-CM | POA: Diagnosis not present

## 2019-01-13 DIAGNOSIS — E119 Type 2 diabetes mellitus without complications: Secondary | ICD-10-CM | POA: Diagnosis not present

## 2019-01-13 DIAGNOSIS — E785 Hyperlipidemia, unspecified: Secondary | ICD-10-CM | POA: Diagnosis not present

## 2019-02-16 DIAGNOSIS — E039 Hypothyroidism, unspecified: Secondary | ICD-10-CM | POA: Diagnosis not present

## 2019-02-19 DIAGNOSIS — Z1389 Encounter for screening for other disorder: Secondary | ICD-10-CM | POA: Diagnosis not present

## 2019-02-19 DIAGNOSIS — Z0000 Encounter for general adult medical examination without abnormal findings: Secondary | ICD-10-CM | POA: Diagnosis not present

## 2019-02-19 DIAGNOSIS — E039 Hypothyroidism, unspecified: Secondary | ICD-10-CM | POA: Diagnosis not present

## 2019-02-19 DIAGNOSIS — Z125 Encounter for screening for malignant neoplasm of prostate: Secondary | ICD-10-CM | POA: Diagnosis not present

## 2019-03-29 DIAGNOSIS — E039 Hypothyroidism, unspecified: Secondary | ICD-10-CM | POA: Diagnosis not present

## 2019-03-29 DIAGNOSIS — Z125 Encounter for screening for malignant neoplasm of prostate: Secondary | ICD-10-CM | POA: Diagnosis not present

## 2019-07-08 DIAGNOSIS — Z23 Encounter for immunization: Secondary | ICD-10-CM | POA: Diagnosis not present

## 2020-01-19 DIAGNOSIS — E039 Hypothyroidism, unspecified: Secondary | ICD-10-CM | POA: Diagnosis not present

## 2020-01-19 DIAGNOSIS — E1169 Type 2 diabetes mellitus with other specified complication: Secondary | ICD-10-CM | POA: Diagnosis not present

## 2020-01-19 DIAGNOSIS — E119 Type 2 diabetes mellitus without complications: Secondary | ICD-10-CM | POA: Diagnosis not present

## 2020-01-19 DIAGNOSIS — E7800 Pure hypercholesterolemia, unspecified: Secondary | ICD-10-CM | POA: Diagnosis not present

## 2020-01-19 DIAGNOSIS — Z125 Encounter for screening for malignant neoplasm of prostate: Secondary | ICD-10-CM | POA: Diagnosis not present

## 2020-02-15 ENCOUNTER — Other Ambulatory Visit: Payer: Self-pay

## 2020-02-15 ENCOUNTER — Encounter: Payer: Medicare Other | Attending: Internal Medicine | Admitting: Dietician

## 2020-02-15 ENCOUNTER — Encounter: Payer: Self-pay | Admitting: Dietician

## 2020-02-15 DIAGNOSIS — E119 Type 2 diabetes mellitus without complications: Secondary | ICD-10-CM | POA: Insufficient documentation

## 2020-02-15 NOTE — Progress Notes (Signed)
Patient was seen on 02/15/2023 for the first of a series of three diabetes self-management courses at the [facility].  Patient Education Plan per assessed needs and concerns is to attend three course education program for Diabetes Self Management Education.  The following learning objectives were met by the patient during this class:  Describe diabetes, types of diabetes and pathophysiology  State some common risk factors for diabetes  Defines the role of glucose and insulin  Describe the relationship between diabetes and cardiovascular and other risks  State the members of the Healthcare Team  States the rationale for glucose monitoring and when to test  State their individual [place] the importance of logging glucose readings and how to interpret the readings  Identifies A1C target  Explain the correlation between A1c and eAG values  State symptoms and treatment of high blood glucose and low blood glucose  Explain proper technique for glucose testing and identify proper sharps disposal  Handouts given during class include:  How to Thrive:  A Guide for Your Journey with Diabetes by the ADA  Meal Plan Card and carbohydrate content list  Dietary intake form  Low Sodium Flavoring Tips  Types of Fats  Dining Out  Label reading  Snack list  Planning a balanced meal  The diabetes portion plate  Diabetes Resources  A1c to eAG Conversion Chart  Blood Glucose Log  Diabetes Recommended Care Schedule  Support Group  Diabetes Success Plan  Core Class Satisfaction Survey   Follow-Up Plan:  Attend core 2

## 2020-02-22 ENCOUNTER — Encounter: Payer: Self-pay | Admitting: Dietician

## 2020-02-22 ENCOUNTER — Encounter: Payer: Medicare Other | Attending: Internal Medicine | Admitting: Dietician

## 2020-02-22 ENCOUNTER — Other Ambulatory Visit: Payer: Self-pay

## 2020-02-22 DIAGNOSIS — E119 Type 2 diabetes mellitus without complications: Secondary | ICD-10-CM | POA: Diagnosis not present

## 2020-02-22 NOTE — Progress Notes (Signed)
Patient was seen on 02/22/2020 for the second of a series of three diabetes self-management courses at the [facility]. The following learning objectives were met by the patient during this class:   Describe the role of different macronutrients on glucose  Explain how carbohydrates affect blood glucose  State what foods contain the most carbohydrates  Demonstrate carbohydrate counting  Demonstrate how to read Nutrition Facts food label  Describe effects of various fats on heart health  Describe the importance of good nutrition for health and healthy eating strategies  Describe techniques for managing your shopping, cooking and meal planning  List strategies to follow meal plan when dining out  Describe the effects of alcohol on glucose and how to use it safely  Goals:  Follow Diabetes Meal Plan as instructed  Aim to spread carbs evenly throughout the day  Aim for 3 meals per day and snacks as needed Include lean protein foods to meals/snacks  Monitor glucose levels as instructed by your doctor   Follow-Up Plan:  Attend Core 3  Work towards following your personal food plan.

## 2020-02-29 ENCOUNTER — Encounter: Payer: Medicare Other | Admitting: Dietician

## 2020-02-29 ENCOUNTER — Other Ambulatory Visit: Payer: Self-pay

## 2020-02-29 DIAGNOSIS — E119 Type 2 diabetes mellitus without complications: Secondary | ICD-10-CM

## 2020-03-09 ENCOUNTER — Encounter: Payer: Self-pay | Admitting: Dietician

## 2020-03-09 NOTE — Progress Notes (Signed)
Patient was seen on 02/29/2020 for the third of a series of three diabetes self-management courses at the [facility].   [place] the amount of activity recommended for healthy living . Describe activities suitable for individual needs . Identify ways to regularly incorporate activity into daily life . Identify barriers to activity and ways to over come these barriers  Identify diabetes medications being personally used and their primary action for lowering glucose and possible side effects . Describe role of stress on blood glucose and develop strategies to address psychosocial issues . Identify diabetes complications and ways to prevent them  Explain how to manage diabetes during illness . Evaluate success in meeting personal goal . Establish 2-3 goals that they will plan to diligently work on  Goals:   I will be active 45 minutes or more 4 times a week  I will eat less unhealthy fats  I will test my glucose at least 2 times a day, 4 days a week  Your patient has identified these potential barriers to change:  Stress  Your patient has identified their diabetes self-care support plan as  Family Support   Plan:  Attend Support Group as desired

## 2020-04-13 DIAGNOSIS — E1169 Type 2 diabetes mellitus with other specified complication: Secondary | ICD-10-CM | POA: Diagnosis not present

## 2020-04-13 DIAGNOSIS — Z7984 Long term (current) use of oral hypoglycemic drugs: Secondary | ICD-10-CM | POA: Diagnosis not present

## 2020-06-09 DIAGNOSIS — Z23 Encounter for immunization: Secondary | ICD-10-CM | POA: Diagnosis not present

## 2020-07-20 DIAGNOSIS — E1169 Type 2 diabetes mellitus with other specified complication: Secondary | ICD-10-CM | POA: Diagnosis not present

## 2020-07-20 DIAGNOSIS — Z7984 Long term (current) use of oral hypoglycemic drugs: Secondary | ICD-10-CM | POA: Diagnosis not present

## 2020-07-20 DIAGNOSIS — E7800 Pure hypercholesterolemia, unspecified: Secondary | ICD-10-CM | POA: Diagnosis not present

## 2020-07-20 DIAGNOSIS — E039 Hypothyroidism, unspecified: Secondary | ICD-10-CM | POA: Diagnosis not present

## 2020-07-20 DIAGNOSIS — I10 Essential (primary) hypertension: Secondary | ICD-10-CM | POA: Diagnosis not present

## 2020-08-25 DIAGNOSIS — Z23 Encounter for immunization: Secondary | ICD-10-CM | POA: Diagnosis not present

## 2021-01-19 DIAGNOSIS — I10 Essential (primary) hypertension: Secondary | ICD-10-CM | POA: Diagnosis not present

## 2021-01-19 DIAGNOSIS — Z1389 Encounter for screening for other disorder: Secondary | ICD-10-CM | POA: Diagnosis not present

## 2021-01-19 DIAGNOSIS — E7800 Pure hypercholesterolemia, unspecified: Secondary | ICD-10-CM | POA: Diagnosis not present

## 2021-01-19 DIAGNOSIS — Z125 Encounter for screening for malignant neoplasm of prostate: Secondary | ICD-10-CM | POA: Diagnosis not present

## 2021-01-19 DIAGNOSIS — Z7984 Long term (current) use of oral hypoglycemic drugs: Secondary | ICD-10-CM | POA: Diagnosis not present

## 2021-01-19 DIAGNOSIS — E1169 Type 2 diabetes mellitus with other specified complication: Secondary | ICD-10-CM | POA: Diagnosis not present

## 2021-01-19 DIAGNOSIS — Z0000 Encounter for general adult medical examination without abnormal findings: Secondary | ICD-10-CM | POA: Diagnosis not present

## 2021-01-19 DIAGNOSIS — E039 Hypothyroidism, unspecified: Secondary | ICD-10-CM | POA: Diagnosis not present

## 2021-02-10 DIAGNOSIS — Z23 Encounter for immunization: Secondary | ICD-10-CM | POA: Diagnosis not present

## 2021-03-06 DIAGNOSIS — E039 Hypothyroidism, unspecified: Secondary | ICD-10-CM | POA: Diagnosis not present

## 2021-06-22 DIAGNOSIS — Z23 Encounter for immunization: Secondary | ICD-10-CM | POA: Diagnosis not present

## 2021-07-19 DIAGNOSIS — E1169 Type 2 diabetes mellitus with other specified complication: Secondary | ICD-10-CM | POA: Diagnosis not present

## 2021-07-19 DIAGNOSIS — E039 Hypothyroidism, unspecified: Secondary | ICD-10-CM | POA: Diagnosis not present

## 2021-07-19 DIAGNOSIS — I10 Essential (primary) hypertension: Secondary | ICD-10-CM | POA: Diagnosis not present

## 2021-07-19 DIAGNOSIS — E7800 Pure hypercholesterolemia, unspecified: Secondary | ICD-10-CM | POA: Diagnosis not present

## 2021-07-19 DIAGNOSIS — Z7984 Long term (current) use of oral hypoglycemic drugs: Secondary | ICD-10-CM | POA: Diagnosis not present

## 2021-10-09 DIAGNOSIS — K648 Other hemorrhoids: Secondary | ICD-10-CM | POA: Diagnosis not present

## 2021-10-09 DIAGNOSIS — Z86010 Personal history of colonic polyps: Secondary | ICD-10-CM | POA: Diagnosis not present

## 2022-01-04 DIAGNOSIS — H5203 Hypermetropia, bilateral: Secondary | ICD-10-CM | POA: Diagnosis not present

## 2022-01-04 DIAGNOSIS — E119 Type 2 diabetes mellitus without complications: Secondary | ICD-10-CM | POA: Diagnosis not present

## 2022-01-04 DIAGNOSIS — Z7984 Long term (current) use of oral hypoglycemic drugs: Secondary | ICD-10-CM | POA: Diagnosis not present

## 2022-01-04 DIAGNOSIS — H52223 Regular astigmatism, bilateral: Secondary | ICD-10-CM | POA: Diagnosis not present

## 2022-01-25 DIAGNOSIS — E7800 Pure hypercholesterolemia, unspecified: Secondary | ICD-10-CM | POA: Diagnosis not present

## 2022-01-25 DIAGNOSIS — Z0000 Encounter for general adult medical examination without abnormal findings: Secondary | ICD-10-CM | POA: Diagnosis not present

## 2022-01-25 DIAGNOSIS — I10 Essential (primary) hypertension: Secondary | ICD-10-CM | POA: Diagnosis not present

## 2022-01-25 DIAGNOSIS — Z125 Encounter for screening for malignant neoplasm of prostate: Secondary | ICD-10-CM | POA: Diagnosis not present

## 2022-01-25 DIAGNOSIS — Z1159 Encounter for screening for other viral diseases: Secondary | ICD-10-CM | POA: Diagnosis not present

## 2022-01-25 DIAGNOSIS — Z1331 Encounter for screening for depression: Secondary | ICD-10-CM | POA: Diagnosis not present

## 2022-01-25 DIAGNOSIS — E1169 Type 2 diabetes mellitus with other specified complication: Secondary | ICD-10-CM | POA: Diagnosis not present

## 2022-05-28 DIAGNOSIS — Z23 Encounter for immunization: Secondary | ICD-10-CM | POA: Diagnosis not present

## 2022-07-24 DIAGNOSIS — Z23 Encounter for immunization: Secondary | ICD-10-CM | POA: Diagnosis not present

## 2022-07-31 DIAGNOSIS — E1169 Type 2 diabetes mellitus with other specified complication: Secondary | ICD-10-CM | POA: Diagnosis not present

## 2022-07-31 DIAGNOSIS — I10 Essential (primary) hypertension: Secondary | ICD-10-CM | POA: Diagnosis not present

## 2022-07-31 DIAGNOSIS — E039 Hypothyroidism, unspecified: Secondary | ICD-10-CM | POA: Diagnosis not present

## 2022-07-31 DIAGNOSIS — E7800 Pure hypercholesterolemia, unspecified: Secondary | ICD-10-CM | POA: Diagnosis not present

## 2022-07-31 DIAGNOSIS — E663 Overweight: Secondary | ICD-10-CM | POA: Diagnosis not present

## 2022-11-29 DIAGNOSIS — E663 Overweight: Secondary | ICD-10-CM | POA: Diagnosis not present

## 2022-11-29 DIAGNOSIS — E1169 Type 2 diabetes mellitus with other specified complication: Secondary | ICD-10-CM | POA: Diagnosis not present

## 2022-11-29 DIAGNOSIS — I10 Essential (primary) hypertension: Secondary | ICD-10-CM | POA: Diagnosis not present

## 2022-11-29 DIAGNOSIS — E7800 Pure hypercholesterolemia, unspecified: Secondary | ICD-10-CM | POA: Diagnosis not present

## 2023-02-04 DIAGNOSIS — Z23 Encounter for immunization: Secondary | ICD-10-CM | POA: Diagnosis not present

## 2023-03-31 DIAGNOSIS — Z1331 Encounter for screening for depression: Secondary | ICD-10-CM | POA: Diagnosis not present

## 2023-03-31 DIAGNOSIS — Z0000 Encounter for general adult medical examination without abnormal findings: Secondary | ICD-10-CM | POA: Diagnosis not present

## 2023-03-31 DIAGNOSIS — E1169 Type 2 diabetes mellitus with other specified complication: Secondary | ICD-10-CM | POA: Diagnosis not present

## 2023-03-31 DIAGNOSIS — E7800 Pure hypercholesterolemia, unspecified: Secondary | ICD-10-CM | POA: Diagnosis not present

## 2023-03-31 DIAGNOSIS — E039 Hypothyroidism, unspecified: Secondary | ICD-10-CM | POA: Diagnosis not present

## 2023-07-15 DIAGNOSIS — Z23 Encounter for immunization: Secondary | ICD-10-CM | POA: Diagnosis not present

## 2023-09-30 DIAGNOSIS — E1169 Type 2 diabetes mellitus with other specified complication: Secondary | ICD-10-CM | POA: Diagnosis not present

## 2023-09-30 DIAGNOSIS — E039 Hypothyroidism, unspecified: Secondary | ICD-10-CM | POA: Diagnosis not present

## 2023-09-30 DIAGNOSIS — Z125 Encounter for screening for malignant neoplasm of prostate: Secondary | ICD-10-CM | POA: Diagnosis not present

## 2023-09-30 DIAGNOSIS — E7800 Pure hypercholesterolemia, unspecified: Secondary | ICD-10-CM | POA: Diagnosis not present

## 2024-05-20 DIAGNOSIS — Z23 Encounter for immunization: Secondary | ICD-10-CM | POA: Diagnosis not present

## 2024-05-27 DIAGNOSIS — E119 Type 2 diabetes mellitus without complications: Secondary | ICD-10-CM | POA: Diagnosis not present

## 2024-05-27 DIAGNOSIS — Z23 Encounter for immunization: Secondary | ICD-10-CM | POA: Diagnosis not present

## 2024-05-27 DIAGNOSIS — E1169 Type 2 diabetes mellitus with other specified complication: Secondary | ICD-10-CM | POA: Diagnosis not present

## 2024-05-27 DIAGNOSIS — E039 Hypothyroidism, unspecified: Secondary | ICD-10-CM | POA: Diagnosis not present

## 2024-05-27 DIAGNOSIS — E7800 Pure hypercholesterolemia, unspecified: Secondary | ICD-10-CM | POA: Diagnosis not present

## 2024-05-27 DIAGNOSIS — I10 Essential (primary) hypertension: Secondary | ICD-10-CM | POA: Diagnosis not present

## 2024-05-27 DIAGNOSIS — Z0000 Encounter for general adult medical examination without abnormal findings: Secondary | ICD-10-CM | POA: Diagnosis not present
